# Patient Record
Sex: Male | Born: 1945
Health system: Southern US, Community
[De-identification: ages and names within clinical notes are randomized; demographics above are authoritative.]

## PROBLEM LIST (undated history)

## (undated) DIAGNOSIS — I10 Essential (primary) hypertension: Secondary | ICD-10-CM

## (undated) DIAGNOSIS — C439 Malignant melanoma of skin, unspecified: Secondary | ICD-10-CM

## (undated) DIAGNOSIS — N400 Enlarged prostate without lower urinary tract symptoms: Secondary | ICD-10-CM

## (undated) DIAGNOSIS — M21612 Bunion of left foot: Secondary | ICD-10-CM

## (undated) HISTORY — DX: Malignant melanoma of skin, unspecified: C43.9

## (undated) HISTORY — PX: TOOTH EXTRACTION: SUR596

## (undated) HISTORY — DX: Benign prostatic hyperplasia without lower urinary tract symptoms: N40.0

## (undated) HISTORY — PX: POLYPECTOMY: SHX149

## (undated) HISTORY — PX: MELANOMA EXCISION: SHX5266

## (undated) HISTORY — DX: Essential (primary) hypertension: I10

## (undated) HISTORY — DX: Bunion of left foot: M21.612

---

## 2005-08-28 ENCOUNTER — Ambulatory Visit: Payer: Self-pay | Admitting: Internal Medicine

## 2005-09-09 ENCOUNTER — Ambulatory Visit: Payer: Self-pay | Admitting: Internal Medicine

## 2005-10-28 ENCOUNTER — Ambulatory Visit: Payer: Self-pay | Admitting: Internal Medicine

## 2005-11-04 ENCOUNTER — Ambulatory Visit: Payer: Self-pay | Admitting: Internal Medicine

## 2006-09-23 ENCOUNTER — Ambulatory Visit: Payer: Self-pay | Admitting: Internal Medicine

## 2006-09-23 LAB — CONVERTED CEMR LAB
ALT: 29 units/L (ref 0–40)
Albumin: 4 g/dL (ref 3.5–5.2)
Alkaline Phosphatase: 29 units/L — ABNORMAL LOW (ref 39–117)
BUN: 8 mg/dL (ref 6–23)
Basophils Absolute: 0 10*3/uL (ref 0.0–0.1)
Basophils Relative: 0.7 % (ref 0.0–1.0)
CO2: 32 meq/L (ref 19–32)
Calcium: 9.6 mg/dL (ref 8.4–10.5)
Cholesterol: 205 mg/dL (ref 0–200)
Creatinine, Ser: 0.9 mg/dL (ref 0.4–1.5)
Direct LDL: 98.1 mg/dL
HDL: 78.3 mg/dL (ref >39.0)
MCHC: 34 g/dL (ref 30.0–36.0)
Monocytes Relative: 10 % (ref 3.0–11.0)
Platelets: 190 10*3/uL (ref 150–400)
Potassium: 4 meq/L (ref 3.5–5.1)
RBC: 4.56 M/uL (ref 4.22–5.81)
RDW: 12.1 % (ref 11.5–14.6)
TSH: 2.52 microintl units/mL (ref 0.35–5.50)
Total Bilirubin: 1 mg/dL (ref 0.3–1.2)
Total CHOL/HDL Ratio: 2.6
Total Protein: 6.8 g/dL (ref 6.0–8.3)
Triglycerides: 78 mg/dL (ref 0–149)
VLDL: 16 mg/dL (ref 0–40)

## 2006-09-30 ENCOUNTER — Ambulatory Visit: Payer: Self-pay | Admitting: Internal Medicine

## 2007-09-24 ENCOUNTER — Ambulatory Visit: Payer: Self-pay | Admitting: Internal Medicine

## 2007-09-24 LAB — CONVERTED CEMR LAB
Albumin: 3.9 g/dL (ref 3.5–5.2)
Alkaline Phosphatase: 34 units/L — ABNORMAL LOW (ref 39–117)
BUN: 13 mg/dL (ref 6–23)
Basophils Absolute: 0 10*3/uL (ref 0.0–0.1)
Bilirubin Urine: NEGATIVE
CO2: 31 meq/L (ref 19–32)
Calcium: 9.1 mg/dL (ref 8.4–10.5)
Cholesterol: 171 mg/dL (ref 0–200)
Eosinophils Absolute: 0 10*3/uL (ref 0.0–0.6)
GFR calc Af Amer: 110 mL/min
GFR calc non Af Amer: 91 mL/min
HDL: 54.5 mg/dL (ref >39.0)
Hemoglobin: 14.3 g/dL (ref 13.0–17.0)
Ketones, urine, test strip: NEGATIVE
Lymphocytes Relative: 16.1 % (ref 12.0–46.0)
MCHC: 33.5 g/dL (ref 30.0–36.0)
MCV: 94.4 fL (ref 78.0–100.0)
Monocytes Absolute: 0.4 10*3/uL (ref 0.2–0.7)
Monocytes Relative: 7.2 % (ref 3.0–11.0)
Neutro Abs: 4 10*3/uL (ref 1.4–7.7)
Nitrite: NEGATIVE
Platelets: 168 10*3/uL (ref 150–400)
Potassium: 4.1 meq/L (ref 3.5–5.1)
TSH: 0.89 microintl units/mL (ref 0.35–5.50)
Total Protein: 6.5 g/dL (ref 6.0–8.3)
Triglycerides: 86 mg/dL (ref 0–149)
pH: 6.5

## 2007-10-01 ENCOUNTER — Ambulatory Visit: Payer: Self-pay | Admitting: Internal Medicine

## 2007-10-01 DIAGNOSIS — G4700 Insomnia, unspecified: Secondary | ICD-10-CM | POA: Insufficient documentation

## 2007-10-03 HISTORY — PX: COLONOSCOPY: SHX174

## 2007-10-18 ENCOUNTER — Ambulatory Visit: Payer: Self-pay | Admitting: Gastroenterology

## 2007-10-28 ENCOUNTER — Encounter: Payer: Self-pay | Admitting: Internal Medicine

## 2007-10-28 ENCOUNTER — Encounter: Payer: Self-pay | Admitting: Gastroenterology

## 2007-10-28 ENCOUNTER — Ambulatory Visit: Payer: Self-pay | Admitting: Gastroenterology

## 2007-10-28 LAB — HM COLONOSCOPY

## 2008-02-08 ENCOUNTER — Telehealth: Payer: Self-pay | Admitting: Internal Medicine

## 2008-02-09 ENCOUNTER — Telehealth: Payer: Self-pay | Admitting: Internal Medicine

## 2008-08-29 ENCOUNTER — Ambulatory Visit: Payer: Self-pay | Admitting: Internal Medicine

## 2008-08-30 ENCOUNTER — Telehealth: Payer: Self-pay | Admitting: Internal Medicine

## 2008-09-04 LAB — CONVERTED CEMR LAB
Basophils Absolute: 0 10*3/uL (ref 0.0–0.1)
Bilirubin, Direct: 0.1 mg/dL (ref 0.0–0.3)
CO2: 31 meq/L (ref 19–32)
Calcium: 9.5 mg/dL (ref 8.4–10.5)
Cholesterol: 203 mg/dL (ref 0–200)
GFR calc Af Amer: 97 mL/min
Hemoglobin: 13.9 g/dL (ref 13.0–17.0)
Lymphocytes Relative: 28.1 % (ref 12.0–46.0)
MCHC: 34.8 g/dL (ref 30.0–36.0)
Monocytes Absolute: 0.4 10*3/uL (ref 0.1–1.0)
Neutro Abs: 3.8 10*3/uL (ref 1.4–7.7)
PSA: 0.81 ng/mL (ref 0.10–4.00)
Platelets: 182 10*3/uL (ref 150–400)
RDW: 12.6 % (ref 11.5–14.6)
Sodium: 139 meq/L (ref 135–145)
TSH: 1.57 microintl units/mL (ref 0.35–5.50)
Total Bilirubin: 1.1 mg/dL (ref 0.3–1.2)
Triglycerides: 42 mg/dL (ref 0–149)
VLDL: 8 mg/dL (ref 0–40)

## 2009-04-17 ENCOUNTER — Telehealth: Payer: Self-pay | Admitting: *Deleted

## 2009-10-08 ENCOUNTER — Ambulatory Visit: Payer: Self-pay | Admitting: Internal Medicine

## 2009-10-08 LAB — CONVERTED CEMR LAB
Albumin: 4.1 g/dL (ref 3.5–5.2)
BUN: 16 mg/dL (ref 6–23)
Basophils Relative: 0.7 % (ref 0.0–3.0)
Bilirubin Urine: NEGATIVE
Bilirubin, Direct: 0.1 mg/dL (ref 0.0–0.3)
CO2: 30 meq/L (ref 19–32)
Chloride: 102 meq/L (ref 96–112)
Cholesterol: 195 mg/dL (ref 0–200)
Creatinine, Ser: 1 mg/dL (ref 0.4–1.5)
Eosinophils Absolute: 0.1 10*3/uL (ref 0.0–0.7)
Glucose, Urine, Semiquant: NEGATIVE
Ketones, urine, test strip: NEGATIVE
MCHC: 33.2 g/dL (ref 30.0–36.0)
MCV: 97.7 fL (ref 78.0–100.0)
Monocytes Absolute: 0.3 10*3/uL (ref 0.1–1.0)
Neutrophils Relative %: 50.8 % (ref 43.0–77.0)
PSA: 0.91 ng/mL (ref 0.10–4.00)
RBC: 4.28 M/uL (ref 4.22–5.81)
Specific Gravity, Urine: 1.015
TSH: 2.06 microintl units/mL (ref 0.35–5.50)
Total CHOL/HDL Ratio: 2
Total Protein: 7 g/dL (ref 6.0–8.3)
Triglycerides: 63 mg/dL (ref 0.0–149.0)

## 2009-10-15 ENCOUNTER — Ambulatory Visit: Payer: Self-pay | Admitting: Internal Medicine

## 2010-09-04 NOTE — Assessment & Plan Note (Signed)
Summary: cpx//ccm   Vital Signs:  Patient profile:   65 year old male Height:      70 inches Weight:      147 pounds BMI:     21.17 Temp:     98.2 degrees F oral Pulse rate:   72 / minute Resp:     14 per minute BP sitting:   126 / 74  (left arm)  Vitals Entered By: Willy Eddy, LPN (October 15, 2009 3:01 PM) CC: cpx   CC:  cpx.  History of Present Illness: The pt was asked about all immunizations, health maint. services that are appropriate to their age and was given guidance on diet exercize  and weight management   Preventive Screening-Counseling & Management  Alcohol-Tobacco     Smoking Status: quit     Year Quit: 1977     Pack years: 5     Passive Smoke Exposure: no  Problems Prior to Update: 1)  Insomnia, Chronic, Mild  (ICD-307.42) 2)  Physical Examination  (ICD-V70.0) 3)  Family History of Melanoma  (ICD-V16.8)  Current Problems (verified): 1)  Insomnia, Chronic, Mild  (ICD-307.42) 2)  Physical Examination  (ICD-V70.0) 3)  Family History of Melanoma  (ICD-V16.8)  Medications Prior to Update: 1)  Bayer Aspirin 325 Mg  Tabs (Aspirin) 2)  Multivitamins   Tabs (Multiple Vitamin) .... Once Daily 3)  Vitamin C 1000 Mg  Tabs (Ascorbic Acid) .... Once Daily 4)  D 400   Caps (Vitamins A & D) .... Once Daily 5)  Glucosamine 1500 Complex  Caps (Glucosamine-Chondroit-Vit C-Mn) .Marland Kitchen.. 1 Once Daily 6)  Omega-3 350 Mg Caps (Omega-3 Fatty Acids) .Marland Kitchen.. 1 Once Daily  Current Medications (verified): 1)  Bayer Aspirin 325 Mg  Tabs (Aspirin) 2)  Multivitamins   Tabs (Multiple Vitamin) .... Once Daily 3)  Vitamin C 1000 Mg  Tabs (Ascorbic Acid) .... Once Daily 4)  D 400   Caps (Vitamins A & D) .... Once Daily 5)  Glucosamine 1500 Complex  Caps (Glucosamine-Chondroit-Vit C-Mn) .Marland Kitchen.. 1 Once Daily 6)  Omega 3 1000mg  .... 4  Once Daily  Allergies (verified): No Known Drug Allergies  Past History:  Family History: Last updated: 10/01/2007 Family History  Hypertension Family History of Melanoma mother passed form complicaton of alzhemenrs oin 40's  Social History: Last updated: 04/29/2007 Occupation: Married Former Smoker Alcohol use-yes Drug use-no Regular exercise-yes  Risk Factors: Exercise: yes (04/29/2007)  Risk Factors: Smoking Status: quit (10/15/2009) Passive Smoke Exposure: no (10/15/2009)  Past medical, surgical, family and social histories (including risk factors) reviewed, and no changes noted (except as noted below).  Past Medical History: Reviewed history from 10/01/2007 and no changes required. Unremarkable  Past Surgical History: Reviewed history from 10/01/2007 and no changes required. Flexible Sigmoidoscopy  2004  Family History: Reviewed history from 10/01/2007 and no changes required. Family History Hypertension Family History of Melanoma mother passed form complicaton of alzhemenrs oin 95's  Social History: Reviewed history from 04/29/2007 and no changes required. Occupation: Married Former Smoker Alcohol use-yes Drug use-no Regular exercise-yes  Review of Systems  The patient denies anorexia, fever, weight loss, weight gain, vision loss, decreased hearing, hoarseness, chest pain, syncope, dyspnea on exertion, peripheral edema, prolonged cough, headaches, hemoptysis, abdominal pain, melena, hematochezia, severe indigestion/heartburn, hematuria, incontinence, genital sores, muscle weakness, suspicious skin lesions, transient blindness, difficulty walking, depression, unusual weight change, abnormal bleeding, enlarged lymph nodes, angioedema, breast masses, and testicular masses.    Physical Exam  General:  Well-developed,well-nourished,in no  acute distress; alert,appropriate and cooperative throughout examination Head:  atraumatic and male-pattern balding.   Eyes:  pupils equal and pupils round.   Ears:  R ear normal and L ear normal.   Nose:  no external deformity and no nasal discharge.    Neck:  No deformities, masses, or tenderness noted. Lungs:  normal respiratory effort, normal breath sounds, no fremitus, and no wheezes.   Heart:  normal rate and regular rhythm.   Abdomen:  soft, normal bowel sounds, no distention, and no masses.   Rectal:  no external abnormalities and no masses.   Genitalia:  no urethral discharge.   Msk:  normal ROM, no joint swelling, no joint warmth, and no redness over joints.   Pulses:  R and L carotid,radial,femoral,dorsalis pedis and posterior tibial pulses are full and equal bilaterally Extremities:  No clubbing, cyanosis, edema, or deformity noted with normal full range of motion of all joints.   Neurologic:  No cranial nerve deficits noted. Station and gait are normal. Plantar reflexes are down-going bilaterally. DTRs are symmetrical throughout. Sensory, motor and coordinative functions appear intact. Skin:  Intact without suspicious lesions or rashes Cervical Nodes:  No lymphadenopathy noted Axillary Nodes:  No palpable lymphadenopathy Inguinal Nodes:  No significant adenopathy Psych:  Oriented X3, memory intact for recent and remote, and normally interactive.     Impression & Recommendations:  Problem # 1:  PHYSICAL EXAMINATION (ICD-V70.0) The pt was asked about all immunizations, health maint. services that are appropriate to their age and was given guidance on diet exercize  and weight management  Colonoscopy: abnormal (10/28/2007) Td Booster: Historical (08/04/2005)   Flu Vax: Historical (07/04/2009)   Chol: 195 (10/08/2009)   HDL: 83.80 (10/08/2009)   LDL: 99 (10/08/2009)   TG: 63.0 (10/08/2009) TSH: 2.06 (10/08/2009)   PSA: 0.91 (10/08/2009) Next Colonoscopy due:: 11/2012 (08/29/2008)  Discussed using sunscreen, use of alcohol, drug use, self testicular exam, routine dental care, routine eye care, routine physical exam, seat belts, multiple vitamins, osteoporosis prevention, adequate calcium intake in diet, and recommendations for  immunizations.  Discussed exercise and checking cholesterol.  Discussed gun safety, safe sex, and contraception. Also recommend checking PSA.  Complete Medication List: 1)  Bayer Aspirin 325 Mg Tabs (Aspirin) 2)  Multivitamins Tabs (Multiple vitamin) .... Once daily 3)  Vitamin C 1000 Mg Tabs (Ascorbic acid) .... Once daily 4)  D 400 Caps (Vitamins a & d) .... Once daily 5)  Glucosamine 1500 Complex Caps (Glucosamine-chondroit-vit c-mn) .Marland Kitchen.. 1 once daily 6)  Omega 3 1000mg   .... 4  once daily  Patient Instructions: 1)  Please schedule a follow-up appointment in 1 year.  CPX

## 2010-10-16 ENCOUNTER — Other Ambulatory Visit (INDEPENDENT_AMBULATORY_CARE_PROVIDER_SITE_OTHER): Payer: Managed Care, Other (non HMO) | Admitting: Internal Medicine

## 2010-10-16 DIAGNOSIS — Z0000 Encounter for general adult medical examination without abnormal findings: Secondary | ICD-10-CM

## 2010-10-16 LAB — BASIC METABOLIC PANEL
CO2: 27 mEq/L (ref 19–32)
Calcium: 9 mg/dL (ref 8.4–10.5)
GFR: 85.82 mL/min (ref >60.00)
Sodium: 134 mEq/L — ABNORMAL LOW (ref 135–145)

## 2010-10-16 LAB — POCT URINALYSIS DIPSTICK
Blood, UA: NEGATIVE
Glucose, UA: NEGATIVE
Spec Grav, UA: 1.015

## 2010-10-16 LAB — CBC WITH DIFFERENTIAL/PLATELET
Basophils Relative: 0.6 % (ref 0.0–3.0)
Eosinophils Relative: 1.1 % (ref 0.0–5.0)
Hemoglobin: 13.7 g/dL (ref 13.0–17.0)
Lymphocytes Relative: 33.7 % (ref 12.0–46.0)
Monocytes Relative: 8 % (ref 3.0–12.0)
Neutro Abs: 2.2 10*3/uL (ref 1.4–7.7)
RBC: 4.16 Mil/uL — ABNORMAL LOW (ref 4.22–5.81)
WBC: 3.9 10*3/uL — ABNORMAL LOW (ref 4.5–10.5)

## 2010-10-16 LAB — HEPATIC FUNCTION PANEL
AST: 35 U/L (ref 0–37)
Albumin: 4 g/dL (ref 3.5–5.2)
Alkaline Phosphatase: 31 U/L — ABNORMAL LOW (ref 39–117)
Total Protein: 6.2 g/dL (ref 6.0–8.3)

## 2010-10-16 LAB — PSA: PSA: 0.98 ng/mL (ref 0.10–4.00)

## 2010-10-16 LAB — LIPID PANEL
Cholesterol: 209 mg/dL — ABNORMAL HIGH (ref 0–200)
HDL: 97.2 mg/dL (ref >39.00)
VLDL: 9.2 mg/dL (ref 0.0–40.0)

## 2010-10-16 LAB — TSH: TSH: 2.8 u[IU]/mL (ref 0.35–5.50)

## 2010-10-22 ENCOUNTER — Encounter: Payer: Self-pay | Admitting: Internal Medicine

## 2010-10-23 ENCOUNTER — Ambulatory Visit (INDEPENDENT_AMBULATORY_CARE_PROVIDER_SITE_OTHER): Payer: Managed Care, Other (non HMO) | Admitting: Internal Medicine

## 2010-10-23 ENCOUNTER — Encounter: Payer: Self-pay | Admitting: Internal Medicine

## 2010-10-23 VITALS — BP 130/80 | HR 72 | Temp 98.1°F | Resp 14 | Ht 68.0 in | Wt 142.0 lb

## 2010-10-23 DIAGNOSIS — Z0000 Encounter for general adult medical examination without abnormal findings: Secondary | ICD-10-CM

## 2010-10-23 NOTE — Progress Notes (Signed)
  Subjective:    Patient ID: Thomas Craig, male    DOB: [birth year], 65 y.o.   MRN: [id number]  HPI  patient is a 65 year old white male presents for complete physical examination he has been healthy he is a runner he is on no prescription medications but takes a myriad of vitamins for her various issues including joints Alzheimer's prevention  And osteoporosis   Review of Systems  Constitutional: Negative for fever and fatigue.  HENT: Negative for hearing loss, congestion, neck pain and postnasal drip.   Eyes: Negative for discharge, redness and visual disturbance.  Respiratory: Negative for cough, shortness of breath and wheezing.   Cardiovascular: Negative for leg swelling.  Gastrointestinal: Negative for abdominal pain, constipation and abdominal distention.  Genitourinary: Negative for urgency and frequency.  Musculoskeletal: Negative for joint swelling and arthralgias.  Skin: Negative for color change and rash.  Neurological: Negative for weakness and light-headedness.  Hematological: Negative for adenopathy.  Psychiatric/Behavioral: Negative for behavioral problems.   Past Medical History  Diagnosis Date  . Arthritis    History reviewed. No pertinent past surgical history.  reports that he quit smoking about 35 years ago. He has never used smokeless tobacco. He reports that he drinks alcohol. He reports that he does not use illicit drugs. family history includes Alzheimer's disease in his father and mother; Cancer in his father; and Melanoma in an unspecified family member. No Known Allergies     Objective:   Physical Exam  Constitutional: He appears well-developed and well-nourished.  HENT:  Head: Normocephalic and atraumatic.  Eyes: Conjunctivae are normal. Pupils are equal, round, and reactive to light.  Neck: Normal range of motion. Neck supple.  Cardiovascular: Normal rate and regular rhythm.   Pulmonary/Chest: Effort normal and breath sounds normal.    Abdominal: Soft. Bowel sounds are normal.          Assessment & Plan:   Patient presents for yearly preventative medicine examination.   all immunizations and health maintenance protocols were reviewed with the patient and they are up to date with these protocols.   screening laboratory values were reviewed with the patient including screening of hyperlipidemia PSA renal function and hepatic function.   There medications past medical history social history problem list and allergies were reviewed in detail.   Goals were established with regard to weight loss exercise diet in compliance with medications

## 2010-11-29 ENCOUNTER — Telehealth: Payer: Self-pay | Admitting: *Deleted

## 2010-11-29 MED ORDER — SULFAMETHOXAZOLE-TRIMETHOPRIM 800-160 MG PO TABS: 1.0000 | ORAL_TABLET | Freq: Two times a day (BID) | ORAL | Status: AC

## 2010-11-29 NOTE — Telephone Encounter (Signed)
Probable prostatitis- start septra ds 1 bid for 2 weeks- call us Monday am and let us know if it has stopped

## 2010-11-29 NOTE — Telephone Encounter (Signed)
Notified pt. 

## 2010-11-29 NOTE — Telephone Encounter (Signed)
Pt calls stating since this am, he has had gross hematuria with no pain or other symptoms.

## 2010-12-11 ENCOUNTER — Telehealth: Payer: Self-pay | Admitting: Internal Medicine

## 2010-12-11 NOTE — Telephone Encounter (Signed)
Pt has allergic reaction to antibiotic. Pt had red face,elevated bp, rash on chest, low grade fever. Pt discontinued antibiotic yesterday. There are 6 pills remaining. Pt has sulfa allergy. Pt just wanted to make Dr Lovell Sheehan aware. Also wants to know if he needs ov?   Pt has a neighbor,Steve Daub, who is a Librarian, academic.  Pts neighbor told pt to take Benadryl and Claritin otc and to stop antibiotic.

## 2010-12-11 NOTE — Telephone Encounter (Signed)
Will place on chart and if sx return let us know

## 2010-12-16 ENCOUNTER — Encounter: Payer: Self-pay | Admitting: Internal Medicine

## 2011-09-29 ENCOUNTER — Ambulatory Visit (INDEPENDENT_AMBULATORY_CARE_PROVIDER_SITE_OTHER): Payer: Managed Care, Other (non HMO) | Admitting: Sports Medicine

## 2011-09-29 VITALS — BP 138/80 | Ht 68.0 in | Wt 145.0 lb

## 2011-09-29 DIAGNOSIS — M25579 Pain in unspecified ankle and joints of unspecified foot: Secondary | ICD-10-CM

## 2011-09-29 DIAGNOSIS — M25571 Pain in right ankle and joints of right foot: Secondary | ICD-10-CM | POA: Insufficient documentation

## 2011-09-29 MED ORDER — KETOPROFEN POWD: Status: DC

## 2011-09-29 NOTE — Patient Instructions (Signed)
1. Wear your black ankle sleeve when you run.  2. You can run but only if you don't have pain or swelling in your ankle.  3. Ice your ankle for 10-15 minutes after you run.  4. We are giving you a topical anti-inflammatory medicine.  Use this 4 times daily.  5. You are ready to run when your limp goes away.  6. Follow up with [place] 3-4 weeks.

## 2011-09-29 NOTE — Progress Notes (Signed)
  Subjective:    Patient ID: Thomas Craig, male    DOB: [birth year], 66 y.o.   MRN: [id number]  HPI 66 y/o male is here c/o right ankle pain.  He thinks the original injury was in December when he had a misstep back in December.  However, he only had pain for one day.  Now that he is training for his marathon he noticed that he had a limp after a 16 mile long run about 3 weeks ago.  On January 31st he noticed that his ankle was swollen.  He took 4 days off of running and still couldn't run.  He took a week off, then was able to run one mile.  Then he tried to run 1.5 miles but had to walk back.  He took another week off.  Last Monday he ran 3 miles, Thursday he ran 5 miles both with no problem.  On Saturday he tried an 8 mile run and had pain 6 miles in.  He tried to walk run but the pain was too bad so he had to walk back.  He has been wearing a compression sock for 3 weeks which helps with the swelling.  He doesn't really have pain with normal activity.  When he does have pain it is located in the vicinity of the posterior tibialis.   Review of Systems     Objective:   Physical Exam  Normal range of motion at the ankle Pain with resisted inversion Mild swelling on the lateral aspect Strength is 5/5 in all directions. Stable lateral and medial ligaments Crepitus with anterior drawer Squeeze test negatvie Talar dome nontender; No tenderness over N spot or navicular prominence No tenderness on posterior aspects of lateral and medial malleolus No sign of peroneal tendon subluxations;   Ultrasound Increased fluid in the joint space The talus has an irregular border and some spurring, medially The posterior tibialis tendons have some fluid surrounding but not within the tendon At the inferior portion of the medial malleolus the posterior tibialis tendons show wear but are intact The peroneal tendons are unremarkable       Assessment & Plan:

## 2011-09-30 NOTE — Assessment & Plan Note (Addendum)
His ongoing pain and swelling is likely secondary to a bony contusion involving the talus.  We will treat this with support and relative rest.  His activity is as tolerated including running.  We have given him ankle support to wear with running.  We have given him exercise instruction as outlined in the patient handouts.

## 2011-10-20 ENCOUNTER — Encounter: Payer: Self-pay | Admitting: Sports Medicine

## 2011-10-20 ENCOUNTER — Ambulatory Visit (INDEPENDENT_AMBULATORY_CARE_PROVIDER_SITE_OTHER): Payer: Managed Care, Other (non HMO) | Admitting: Sports Medicine

## 2011-10-20 VITALS — BP 144/80

## 2011-10-20 DIAGNOSIS — R269 Unspecified abnormalities of gait and mobility: Secondary | ICD-10-CM

## 2011-10-20 DIAGNOSIS — M21612 Bunion of left foot: Secondary | ICD-10-CM

## 2011-10-20 DIAGNOSIS — M21619 Bunion of unspecified foot: Secondary | ICD-10-CM

## 2011-10-20 DIAGNOSIS — M25579 Pain in unspecified ankle and joints of unspecified foot: Secondary | ICD-10-CM

## 2011-10-20 DIAGNOSIS — M25571 Pain in right ankle and joints of right foot: Secondary | ICD-10-CM

## 2011-10-20 HISTORY — DX: Bunion of left foot: M21.612

## 2011-10-20 NOTE — Progress Notes (Signed)
Pt here today to f/u his right ankle, which he says is about 100% better with some swelling.

## 2011-10-20 NOTE — Assessment & Plan Note (Signed)
He has an efficient running gait overall  However, he will need control of pronation to keep him from developing more issues with bunions Also has some damage to talus on RT and need to support this to prevent DJD

## 2011-10-20 NOTE — Progress Notes (Signed)
  Subjective:    Patient ID: Thomas Craig, male    DOB: 1946-01-02, 66 y.o.   MRN: [id number]  HPI  Pt here today to f/u his right ankle, which he says is about 100% better with some swelling.  Since last visit, he stopped running for 2-3 weeks and has been working with PT and doing ankle strength exercises.  Patient was taking Motrin TID and topical NSAID which has improved pain significantly.    He started running again 4 days ago for about 30-40 minutes at a time without any difficulty.  He has been wearing his ankle brace while running.  Patient also states that he "rolled his right ankle" one week ago in [place] on uneven pavement.  Since then he has had some lateral ankle swelling but no pain.  He has been using shoe insoles in both pairs of sneakers.  Soc Hx - works at [employer] so sees PT in [place] Given some posting for insoles that also feels good Review of Systems  Per HPI    Objective:   Physical Exam  Ankle: No visible erythema. Mild swelling on lateral aspect of ankle, but much improved from previous exam. Range of motion is full in all directions. Strength is 5/5 in all directions. Stable lateral and medial ligaments; squeeze test and kleiger test unremarkable; Talar dome nontender; No pain at base of 5th MT; No tenderness over cuboid; No tenderness over N spot or navicular prominence No tenderness on posterior aspects of lateral and medial malleolus No sign of peroneal tendon subluxations; Able to run up and down hallway without pain.  He pronates right foot and has delayed push off.  Foot shows bunion on left There is some hallux rigidus and MTP hypertrophy on RT 1st toe Pronated with loss of long arch bilat  Running gait shows mild foot turnout He does have pronation with gait  Overall pushoff is not quite even on RT but no real limp now       Assessment & Plan:

## 2011-10-20 NOTE — Assessment & Plan Note (Signed)
I think to stay at his current running level he needs better foot support Lots of arch collapse and early bunions  We will try to get him set for orthotics

## 2011-10-20 NOTE — Patient Instructions (Signed)
1. Wear ankle brace for 2 more weeks, then you can run without it. 2. Continue ankle strength exercises and stretching daily. 3. Keep using shoe insoles, you may alternate sneakers. 4. Continue to ice ankle after runs and take Motrin as needed for severe pain. 5. You will likely benefit from custom orthotics in the future to prevent worsening arthritis. 6.  Schedule appointment for new orthotics at your earliest convenience.

## 2011-10-20 NOTE — Assessment & Plan Note (Signed)
Patient's ankle pain has resolved, swelling is slowly improving. Continue to run with ankle support and continue ankle strength exercises with PT. Increase long distance training as tolerated, okay to proceed with [employer]. Patient to return to clinic for new custom orthotics at his earliest convenience.

## 2011-10-24 ENCOUNTER — Other Ambulatory Visit: Payer: Managed Care, Other (non HMO)

## 2011-10-27 ENCOUNTER — Other Ambulatory Visit (INDEPENDENT_AMBULATORY_CARE_PROVIDER_SITE_OTHER): Payer: Managed Care, Other (non HMO)

## 2011-10-27 DIAGNOSIS — Z0000 Encounter for general adult medical examination without abnormal findings: Secondary | ICD-10-CM

## 2011-10-27 LAB — POCT URINALYSIS DIPSTICK
Glucose, UA: NEGATIVE
Ketones, UA: NEGATIVE
Spec Grav, UA: 1.01
Urobilinogen, UA: 0.2

## 2011-10-27 LAB — LIPID PANEL: Triglycerides: 65 mg/dL (ref 0.0–149.0)

## 2011-10-27 LAB — PSA: PSA: 1.18 ng/mL (ref 0.10–4.00)

## 2011-10-27 LAB — CBC WITH DIFFERENTIAL/PLATELET
Basophils Absolute: 0 10*3/uL (ref 0.0–0.1)
Lymphocytes Relative: 19.4 % (ref 12.0–46.0)
Monocytes Relative: 8.3 % (ref 3.0–12.0)
Neutrophils Relative %: 71 % (ref 43.0–77.0)
Platelets: 177 10*3/uL (ref 150.0–400.0)
RDW: 13.1 % (ref 11.5–14.6)

## 2011-10-27 LAB — TSH: TSH: 1.96 u[IU]/mL (ref 0.35–5.50)

## 2011-10-27 LAB — BASIC METABOLIC PANEL
BUN: 19 mg/dL (ref 6–23)
Calcium: 9.2 mg/dL (ref 8.4–10.5)
GFR: 80.58 mL/min (ref >60.00)
Glucose, Bld: 91 mg/dL (ref 70–99)
Sodium: 130 mEq/L — ABNORMAL LOW (ref 135–145)

## 2011-10-27 LAB — HEPATIC FUNCTION PANEL
ALT: 34 U/L (ref 0–53)
AST: 43 U/L — ABNORMAL HIGH (ref 0–37)
Bilirubin, Direct: 0.1 mg/dL (ref 0.0–0.3)
Total Bilirubin: 0.9 mg/dL (ref 0.3–1.2)

## 2011-10-27 LAB — LDL CHOLESTEROL, DIRECT: Direct LDL: 100.5 mg/dL

## 2011-10-27 NOTE — Progress Notes (Signed)
Addended by: Rita Ohara R on: 10/27/2011 10:49 AM   Modules accepted: Orders

## 2011-10-31 ENCOUNTER — Encounter: Payer: Managed Care, Other (non HMO) | Admitting: Internal Medicine

## 2011-11-05 ENCOUNTER — Encounter: Payer: Self-pay | Admitting: Internal Medicine

## 2011-11-05 ENCOUNTER — Ambulatory Visit (INDEPENDENT_AMBULATORY_CARE_PROVIDER_SITE_OTHER): Payer: Managed Care, Other (non HMO) | Admitting: Internal Medicine

## 2011-11-05 VITALS — BP 132/80 | HR 60 | Temp 98.0°F | Resp 14 | Ht 68.0 in | Wt 150.0 lb

## 2011-11-05 DIAGNOSIS — Z0000 Encounter for general adult medical examination without abnormal findings: Secondary | ICD-10-CM

## 2011-11-05 DIAGNOSIS — R7989 Other specified abnormal findings of blood chemistry: Secondary | ICD-10-CM

## 2011-11-05 DIAGNOSIS — T887XXA Unspecified adverse effect of drug or medicament, initial encounter: Secondary | ICD-10-CM

## 2011-11-05 MED ORDER — KETOPROFEN 50 MG PO CAPS: 50.0000 mg | ORAL_CAPSULE | Freq: Four times a day (QID) | ORAL | Status: DC | PRN

## 2011-11-05 NOTE — Patient Instructions (Signed)
The patient is instructed to continue all medications as prescribed. Schedule followup with check out clerk upon leaving the clinic  

## 2011-11-05 NOTE — Progress Notes (Signed)
Subjective:    Patient ID: Thomas Craig, male    DOB: 03/06/1946, 66 y.o.   MRN: [id number]  HPI Patient is a 66 year old male who presents for his yearly physical examination.  He's recently had a new injury to his right ankle area and is followed by Dr. Darrick Penna in the sports medicine clinic.   Review of Systems  Constitutional: Negative for fever and fatigue.  HENT: Negative for hearing loss, congestion, neck pain and postnasal drip.   Eyes: Negative for discharge, redness and visual disturbance.  Respiratory: Negative for cough, shortness of breath and wheezing.   Cardiovascular: Negative for leg swelling.  Gastrointestinal: Negative for abdominal pain, constipation and abdominal distention.  Genitourinary: Negative for urgency and frequency.  Musculoskeletal: Negative for joint swelling and arthralgias.  Skin: Negative for color change and rash.  Neurological: Negative for weakness and light-headedness.  Hematological: Negative for adenopathy.  Psychiatric/Behavioral: Negative for behavioral problems.   Past Medical History  Diagnosis Date  . Arthritis     History   Social History  . Marital Status: Married    Spouse Name: N/A    Number of Children: N/A  . Years of Education: N/A   Occupational History  . Not on file.   Social History Main Topics  . Smoking status: Former Smoker    Quit date: 08/05/1975  . Smokeless tobacco: Never Used   Comment: ONLY SMOKED FOR 5 Y EARS  . Alcohol Use: Yes  . Drug Use: No  . Sexually Active: Yes   Other Topics Concern  . Not on file   Social History Narrative  . No narrative on file    No past surgical history on file.  Family History  Problem Relation Age of Onset  . Melanoma    . Alzheimer's disease Mother   . Alzheimer's disease Father   . Cancer Father     melanoma    Allergies  Allergen Reactions  . Doxycycline     Current Outpatient Prescriptions on File Prior to Visit  Medication Sig Dispense  Refill  . Ascorbic Acid (VITAMIN C) 1000 MG tablet Take 1,000 mg by mouth daily. 1-3 qd       . aspirin 325 MG tablet Take 325 mg by mouth daily.        . Calcium Carbonate-Vitamin D (CALCIUM-VITAMIN D) 500-200 MG-UNIT per tablet Take 1 tablet by mouth daily.        . Ketoprofen POWD Ketoprofen 20% gel aaa tid-qid  100 g  2  . Multiple Vitamin (MULTIVITAMIN) tablet Take 1 tablet by mouth daily.        . OMEGA-3 1000 MG CAPS Take 4 capsules by mouth daily.        . Saw Palmetto, Serenoa repens, 1000 MG CAPS Take by mouth 1 day or 1 dose.       . vitamin E 400 UNIT capsule Take 400 Units by mouth daily.          BP 132/80  Pulse 60  Temp 98 F (36.7 C)  Resp 14  Ht 5\' 8"  (1.727 m)  Wt 150 lb (68.04 kg)  BMI 22.81 kg/m2       Objective:   Physical Exam  Nursing note and vitals reviewed. Constitutional: He is oriented to person, place, and time. He appears well-developed and well-nourished.  HENT:  Head: Normocephalic and atraumatic.  Eyes: Conjunctivae are normal. Pupils are equal, round, and reactive to light.  Neck: Normal range of motion. Neck supple.  Cardiovascular: Normal rate and regular rhythm.   Pulmonary/Chest: Effort normal and breath sounds normal.  Abdominal: Soft. Bowel sounds are normal.  Genitourinary: Rectum normal and prostate normal.  Musculoskeletal: Normal range of motion.  Neurological: He is alert and oriented to person, place, and time.  Skin: Skin is warm and dry.  Psychiatric: He has a normal mood and affect. His behavior is normal.          Assessment & Plan:   Patient presents for yearly preventative medicine examination.   all immunizations and health maintenance protocols were reviewed with the patient and they are up to date with these protocols.   screening laboratory values were reviewed with the patient including screening of hyperlipidemia PSA renal function and hepatic function.   There medications past medical history social  history problem list and allergies were reviewed in detail.   Goals were established with regard to weight loss exercise diet in compliance with medications Elevation of liver functions protected due to excessive use of nonsteroidals also noted a low sodium low chloride probably due to acidosis.  Recommend never taking more than one nonsteroidal at that time even if it is a topical nonsteroidal on a regular basis.

## 2011-11-24 ENCOUNTER — Ambulatory Visit (INDEPENDENT_AMBULATORY_CARE_PROVIDER_SITE_OTHER): Payer: Managed Care, Other (non HMO) | Admitting: Sports Medicine

## 2011-11-24 VITALS — BP 165/80

## 2011-11-24 DIAGNOSIS — R269 Unspecified abnormalities of gait and mobility: Secondary | ICD-10-CM

## 2011-11-26 NOTE — Assessment & Plan Note (Addendum)
Orthotics made today.  These offer a nice correction to his over pronation.    These felt comfortable in office  Use consistetnly next month  We will adjust after that if needed  He will monitor RT ankle and left bunion pain

## 2011-11-26 NOTE — Progress Notes (Signed)
  Subjective:    Patient ID: Thomas Craig, male    DOB: 02/27/1946, 66 y.o.   MRN: [id number]  HPI 66 y/o male is here for custom orthotics.   Has bunion on left Ankle pain on RT RT ankle shows subluxation of foot on ankle with lateral deviaiton of foot Some of same changes developing on LT  Long term distance runner and wants to keep doing marathons Just ran [employer]   Review of Systems     Objective:   Physical Exam  Feet are  unchanged from previous exam.  Patient was fitted for a : standard, cushioned, semi-rigid orthotic. The orthotic was heated and afterward the patient stood on the orthotic blank positioned on the orthotic stand. The patient was positioned in subtalar neutral position and 10 degrees of ankle dorsiflexion in a weight bearing stance. After completion of molding, a stable base was applied to the orthotic blank. The blank was ground to a stable position for weight bearing. Size: 10 Base: blue Posting: none       Assessment & Plan:

## 2011-12-23 ENCOUNTER — Ambulatory Visit: Payer: Managed Care, Other (non HMO) | Admitting: Sports Medicine

## 2012-02-04 ENCOUNTER — Encounter: Payer: Self-pay | Admitting: Internal Medicine

## 2012-02-04 ENCOUNTER — Ambulatory Visit (INDEPENDENT_AMBULATORY_CARE_PROVIDER_SITE_OTHER): Payer: Managed Care, Other (non HMO) | Admitting: Internal Medicine

## 2012-02-04 VITALS — BP 136/80 | HR 72 | Temp 98.6°F | Resp 16 | Ht 68.0 in | Wt 151.0 lb

## 2012-02-04 DIAGNOSIS — M25579 Pain in unspecified ankle and joints of unspecified foot: Secondary | ICD-10-CM

## 2012-02-04 DIAGNOSIS — M25571 Pain in right ankle and joints of right foot: Secondary | ICD-10-CM

## 2012-02-04 DIAGNOSIS — T887XXA Unspecified adverse effect of drug or medicament, initial encounter: Secondary | ICD-10-CM

## 2012-02-04 DIAGNOSIS — R269 Unspecified abnormalities of gait and mobility: Secondary | ICD-10-CM

## 2012-02-04 LAB — HEPATIC FUNCTION PANEL
AST: 32 U/L (ref 0–37)
Total Bilirubin: 0.9 mg/dL (ref 0.3–1.2)

## 2012-02-04 NOTE — Patient Instructions (Addendum)
The patient is instructed to continue all medications as prescribed. Schedule followup with check out clerk upon leaving the clinic  

## 2012-02-04 NOTE — Progress Notes (Signed)
Subjective:    Patient ID: Thomas Craig, male    DOB: July 19, 1946, 66 y.o.   MRN: [id number]  HPI  The pt has been running and has been using orthotics Has not been taking the IBU Swelling has been better  Review of Systems  Constitutional: Negative for fever and fatigue.  HENT: Negative for hearing loss, congestion, neck pain and postnasal drip.   Eyes: Negative for discharge, redness and visual disturbance.  Respiratory: Negative for cough, shortness of breath and wheezing.   Cardiovascular: Negative for leg swelling.  Gastrointestinal: Negative for abdominal pain, constipation and abdominal distention.  Genitourinary: Negative for urgency and frequency.  Musculoskeletal: Negative for joint swelling and arthralgias.  Skin: Negative for color change and rash.  Neurological: Negative for weakness and light-headedness.  Hematological: Negative for adenopathy.  Psychiatric/Behavioral: Negative for behavioral problems.   Past Medical History  Diagnosis Date  . Arthritis     History   Social History  . Marital Status: Married    Spouse Name: N/A    Number of Children: N/A  . Years of Education: N/A   Occupational History  . Not on file.   Social History Main Topics  . Smoking status: Former Smoker    Quit date: 08/05/1975  . Smokeless tobacco: Never Used   Comment: ONLY SMOKED FOR 5 Y EARS  . Alcohol Use: Yes  . Drug Use: No  . Sexually Active: Yes   Other Topics Concern  . Not on file   Social History Narrative  . No narrative on file    No past surgical history on file.  Family History  Problem Relation Age of Onset  . Melanoma    . Alzheimer's disease Mother   . Alzheimer's disease Father   . Cancer Father     melanoma    Allergies  Allergen Reactions  . Doxycycline     Current Outpatient Prescriptions on File Prior to Visit  Medication Sig Dispense Refill  . Ascorbic Acid (VITAMIN C) 1000 MG tablet Take 1,000 mg by mouth daily. 1-3 qd     . aspirin 325 MG tablet Take 325 mg by mouth daily.        . Calcium Carbonate-Vitamin D (CALCIUM-VITAMIN D) 500-200 MG-UNIT per tablet Take 1 tablet by mouth daily.        . Glucosamine-Chondroit-Vit C-Mn (GLUCOSAMINE CHONDR 1500 COMPLX) CAPS Take 1 capsule by mouth daily.      . Multiple Vitamin (MULTIVITAMIN) tablet Take 1 tablet by mouth daily.        . OMEGA-3 1000 MG CAPS Take 4 capsules by mouth daily.        . Saw Palmetto, Serenoa repens, 1000 MG CAPS Take by mouth 1 day or 1 dose.         BP 136/80  Pulse 72  Temp 98.6 F (37 C)  Resp 16  Ht 5\' 8"  (1.727 m)  Wt 151 lb (68.493 kg)  BMI 22.96 kg/m2        Objective:   Physical Exam  Nursing note and vitals reviewed. Constitutional: He is oriented to person, place, and time.  Musculoskeletal: Normal range of motion.  Neurological: He is alert and oriented to person, place, and time.  Skin: Skin is warm and dry.  Psychiatric: He has a normal mood and affect. His behavior is normal.          Assessment & Plan:  Discussed, related to trauma from per spray in the [facility].  His  ankle injury has improved.  We discussed abnormal liver functions at last office visit which were felt to be secondary to nonsteroidal use.  Repeat liver functions today and notify patient of any abnormality.  Physical in March

## 2012-10-11 ENCOUNTER — Other Ambulatory Visit (INDEPENDENT_AMBULATORY_CARE_PROVIDER_SITE_OTHER): Payer: Managed Care, Other (non HMO)

## 2012-10-11 DIAGNOSIS — Z0000 Encounter for general adult medical examination without abnormal findings: Secondary | ICD-10-CM

## 2012-10-11 LAB — BASIC METABOLIC PANEL
BUN: 16 mg/dL (ref 6–23)
Calcium: 9 mg/dL (ref 8.4–10.5)
Creatinine, Ser: 0.9 mg/dL (ref 0.4–1.5)
GFR: 88.54 mL/min (ref >60.00)
Glucose, Bld: 88 mg/dL (ref 70–99)
Sodium: 133 mEq/L — ABNORMAL LOW (ref 135–145)

## 2012-10-11 LAB — CBC WITH DIFFERENTIAL/PLATELET
Basophils Relative: 0.6 % (ref 0.0–3.0)
Eosinophils Relative: 0.7 % (ref 0.0–5.0)
HCT: 36.9 % — ABNORMAL LOW (ref 39.0–52.0)
Hemoglobin: 12.6 g/dL — ABNORMAL LOW (ref 13.0–17.0)
Lymphs Abs: 1.2 10*3/uL (ref 0.7–4.0)
MCV: 92.3 fl (ref 78.0–100.0)
Monocytes Absolute: 0.3 10*3/uL (ref 0.1–1.0)
Neutro Abs: 2.3 10*3/uL (ref 1.4–7.7)
Platelets: 222 10*3/uL (ref 150.0–400.0)
WBC: 3.8 10*3/uL — ABNORMAL LOW (ref 4.5–10.5)

## 2012-10-11 LAB — HEPATIC FUNCTION PANEL
AST: 32 U/L (ref 0–37)
Albumin: 3.6 g/dL (ref 3.5–5.2)
Total Bilirubin: 0.9 mg/dL (ref 0.3–1.2)

## 2012-10-11 LAB — POCT URINALYSIS DIPSTICK
Bilirubin, UA: NEGATIVE
Ketones, UA: NEGATIVE
Leukocytes, UA: NEGATIVE
Nitrite, UA: NEGATIVE

## 2012-10-11 LAB — TSH: TSH: 1.98 u[IU]/mL (ref 0.35–5.50)

## 2012-10-11 LAB — PSA: PSA: 1.11 ng/mL (ref 0.10–4.00)

## 2012-10-18 ENCOUNTER — Encounter: Payer: Self-pay | Admitting: Internal Medicine

## 2012-10-18 ENCOUNTER — Ambulatory Visit (INDEPENDENT_AMBULATORY_CARE_PROVIDER_SITE_OTHER): Payer: Managed Care, Other (non HMO) | Admitting: Internal Medicine

## 2012-10-18 VITALS — BP 140/90 | HR 68 | Temp 98.0°F | Ht 67.0 in | Wt 149.0 lb

## 2012-10-18 DIAGNOSIS — Z0000 Encounter for general adult medical examination without abnormal findings: Secondary | ICD-10-CM

## 2012-10-18 DIAGNOSIS — Z23 Encounter for immunization: Secondary | ICD-10-CM

## 2012-10-18 DIAGNOSIS — D509 Iron deficiency anemia, unspecified: Secondary | ICD-10-CM

## 2012-10-18 NOTE — Progress Notes (Signed)
  Subjective:    Patient ID: Thomas Craig, male    DOB: Dec 10, 1945, 67 y.o.   MRN: [id number]  HPI  Anemia Otherwise stable screening labs  Review of Systems  Constitutional: Negative for fever and fatigue.  HENT: Negative for hearing loss, congestion, neck pain and postnasal drip.   Eyes: Negative for discharge, redness and visual disturbance.  Respiratory: Negative for cough, shortness of breath and wheezing.   Cardiovascular: Negative for leg swelling.  Gastrointestinal: Negative for abdominal pain, constipation and abdominal distention.  Genitourinary: Negative for urgency and frequency.  Musculoskeletal: Negative for joint swelling and arthralgias.  Skin: Positive for color change and pallor. Negative for rash.       Red palms  Neurological: Negative for weakness and light-headedness.  Hematological: Negative for adenopathy.  Psychiatric/Behavioral: Negative for behavioral problems.       Objective:   Physical Exam  Constitutional: He appears well-developed and well-nourished.  HENT:  Head: Normocephalic and atraumatic.  Eyes: Conjunctivae are normal. Pupils are equal, round, and reactive to light.  Neck: Normal range of motion. Neck supple.  Cardiovascular: Normal rate and regular rhythm.   Pulmonary/Chest: Effort normal and breath sounds normal.  Abdominal: Soft. Bowel sounds are normal.  Genitourinary:  Enlarged prostate smooth and no mases          Assessment & Plan:

## 2012-10-18 NOTE — Patient Instructions (Addendum)
Down load the my chart app on phone  Take the b12 samples

## 2012-10-20 LAB — METHYLMALONIC ACID, SERUM: Methylmalonic Acid, Quant: 0.1 umol/L (ref <0.40)

## 2012-10-25 ENCOUNTER — Encounter: Payer: Self-pay | Admitting: Internal Medicine

## 2012-12-13 ENCOUNTER — Other Ambulatory Visit: Payer: Self-pay | Admitting: Dermatology

## 2012-12-29 ENCOUNTER — Other Ambulatory Visit: Payer: Self-pay | Admitting: Dermatology

## 2013-04-20 ENCOUNTER — Ambulatory Visit (INDEPENDENT_AMBULATORY_CARE_PROVIDER_SITE_OTHER): Payer: Managed Care, Other (non HMO) | Admitting: Internal Medicine

## 2013-04-20 ENCOUNTER — Encounter: Payer: Self-pay | Admitting: Internal Medicine

## 2013-04-20 VITALS — BP 136/80 | HR 72 | Temp 98.6°F | Resp 16 | Ht 67.0 in | Wt 151.0 lb

## 2013-04-20 DIAGNOSIS — L910 Hypertrophic scar: Secondary | ICD-10-CM

## 2013-04-20 DIAGNOSIS — Z23 Encounter for immunization: Secondary | ICD-10-CM

## 2013-04-20 DIAGNOSIS — D509 Iron deficiency anemia, unspecified: Secondary | ICD-10-CM

## 2013-04-20 LAB — CBC WITH DIFFERENTIAL/PLATELET
Eosinophils Relative: 1 % (ref 0.0–5.0)
HCT: 40.8 % (ref 39.0–52.0)
Monocytes Relative: 7.9 % (ref 3.0–12.0)
Neutrophils Relative %: 56.4 % (ref 43.0–77.0)
Platelets: 186 10*3/uL (ref 150.0–400.0)
WBC: 4.6 10*3/uL (ref 4.5–10.5)

## 2013-04-20 LAB — IRON: Iron: 88 ug/dL (ref 42–165)

## 2013-04-20 NOTE — Progress Notes (Signed)
  Subjective:    Patient ID: Thomas Craig, male    DOB: 1946-06-07, 67 y.o.   MRN: [id number]  HPI Feeling well Still running.. Bunion stable Orthotic did not help PT has helped Annual mole check resulted in superficial melanoma    Review of Systems  Constitutional: Negative for fever and fatigue.  HENT: Negative for hearing loss, congestion, neck pain and postnasal drip.   Eyes: Negative for discharge, redness and visual disturbance.  Respiratory: Negative for cough, shortness of breath and wheezing.   Cardiovascular: Negative for leg swelling.  Gastrointestinal: Negative for abdominal pain, constipation and abdominal distention.  Genitourinary: Negative for urgency and frequency.  Musculoskeletal: Negative for joint swelling and arthralgias.  Skin: Negative for color change and rash.       Biopsy site reveiwed  Neurological: Negative for weakness and light-headedness.  Hematological: Negative for adenopathy.  Psychiatric/Behavioral: Negative for behavioral problems.       Objective:   Physical Exam  Nursing note and vitals reviewed. Constitutional: He appears well-developed and well-nourished.  HENT:  Head: Normocephalic and atraumatic.  Eyes: Conjunctivae are normal. Pupils are equal, round, and reactive to light.  Neck: Normal range of motion. Neck supple.  Cardiovascular: Normal rate and regular rhythm.   Pulmonary/Chest: Effort normal and breath sounds normal.  Abdominal: Soft. Bowel sounds are normal.          Assessment & Plan:  Moderate food pain from bursa Site for melanoma for wide excision Keloid reaction from the internal stitches but biospy site clear Vit e oil  Follow up for iron deficiency

## 2013-04-20 NOTE — Patient Instructions (Addendum)
Vit e oil to site of keloid

## 2013-04-20 NOTE — Addendum Note (Signed)
Addended by: Bonnye Fava on: 04/20/2013 09:34 AM   Modules accepted: Orders

## 2013-04-21 LAB — RETICULOCYTES
RBC.: 4.43 MIL/uL (ref 4.22–5.81)
Retic Ct Pct: 0.9 % (ref 0.4–2.3)

## 2013-04-28 ENCOUNTER — Encounter: Payer: Self-pay | Admitting: Internal Medicine

## 2013-09-23 ENCOUNTER — Other Ambulatory Visit: Payer: Self-pay | Admitting: Physician Assistant

## 2013-10-17 ENCOUNTER — Other Ambulatory Visit: Payer: Managed Care, Other (non HMO)

## 2013-10-24 ENCOUNTER — Encounter: Payer: Managed Care, Other (non HMO) | Admitting: Internal Medicine

## 2013-10-26 ENCOUNTER — Encounter: Payer: Managed Care, Other (non HMO) | Admitting: Internal Medicine

## 2014-04-28 ENCOUNTER — Other Ambulatory Visit: Payer: Managed Care, Other (non HMO)

## 2014-05-05 ENCOUNTER — Encounter: Payer: Managed Care, Other (non HMO) | Admitting: Internal Medicine

## 2014-05-17 ENCOUNTER — Encounter: Payer: Self-pay | Admitting: Family Medicine

## 2014-08-03 ENCOUNTER — Ambulatory Visit: Payer: Managed Care, Other (non HMO) | Admitting: Family Medicine

## 2014-09-05 ENCOUNTER — Encounter: Payer: Self-pay | Admitting: Family Medicine

## 2014-09-05 ENCOUNTER — Ambulatory Visit (INDEPENDENT_AMBULATORY_CARE_PROVIDER_SITE_OTHER): Payer: Managed Care, Other (non HMO) | Admitting: Family Medicine

## 2014-09-05 DIAGNOSIS — IMO0001 Reserved for inherently not codable concepts without codable children: Secondary | ICD-10-CM

## 2014-09-05 DIAGNOSIS — Z23 Encounter for immunization: Secondary | ICD-10-CM

## 2014-09-05 DIAGNOSIS — C439 Malignant melanoma of skin, unspecified: Secondary | ICD-10-CM

## 2014-09-05 DIAGNOSIS — R030 Elevated blood-pressure reading, without diagnosis of hypertension: Secondary | ICD-10-CM

## 2014-09-05 DIAGNOSIS — Z85820 Personal history of malignant melanoma of skin: Secondary | ICD-10-CM | POA: Insufficient documentation

## 2014-09-05 DIAGNOSIS — M21612 Bunion of left foot: Secondary | ICD-10-CM

## 2014-09-05 DIAGNOSIS — I10 Essential (primary) hypertension: Secondary | ICD-10-CM | POA: Insufficient documentation

## 2014-09-05 DIAGNOSIS — N400 Enlarged prostate without lower urinary tract symptoms: Secondary | ICD-10-CM

## 2014-09-05 DIAGNOSIS — M2012 Hallux valgus (acquired), left foot: Secondary | ICD-10-CM

## 2014-09-05 DIAGNOSIS — E785 Hyperlipidemia, unspecified: Secondary | ICD-10-CM

## 2014-09-05 HISTORY — DX: Benign prostatic hyperplasia without lower urinary tract symptoms: N40.0

## 2014-09-05 HISTORY — DX: Malignant melanoma of skin, unspecified: C43.9

## 2014-09-05 NOTE — Assessment & Plan Note (Signed)
Continue saw palmetto. Patient comfortable with current level of symptoms and does not want to take flomax.

## 2014-09-05 NOTE — Assessment & Plan Note (Addendum)
Intermittent elevation of Bp about every other visit. We discussed DASH diet. If elevated 2 visits in a row, would need to discuss BP medication. Home monitoring advised with sooner return reasons discussed.

## 2014-09-05 NOTE — Progress Notes (Signed)
Thomas Reddish, MD Phone: [phone]  Subjective:  Patient presents today to establish care with me as their new primary care provider. Patient was formerly a patient of Dr. Arnoldo Morale. Chief complaint-noted.   Elevated blood pressure-poor control about every other visit  BP Readings from Last 3 Encounters:  09/05/14 150/90  04/20/13 136/80  10/18/12 140/90  History of running marathons, wants to get back into it but history of injuries Home BP monitoring-no  Compliant with medications-yes without side effects ROS-Denies any CP, HA, SOB, blurry vision, LE edema.   Hyperlipidemia-mild poor control  Lab Results  Component Value Date   LDLCALC 110* 10/11/2012  On statin: no Regular exercise: yes, runner Diet: good amount of processed food as traveling for work ROS- no chest pain or shortness of breath. No myalgias  BPH reasonable control Saw palmetto has improved nocturia 3-5x a night to 1-2x a night.  ROS- no changes in urination pattern, no dribbling  The following were reviewed and entered/updated in epic: Past Medical History  Diagnosis Date  . Melanoma of skin 09/05/2014    Dr. Allyson Sabal. Every 6 month follow up.    Marland Kitchen BPH (benign prostatic hyperplasia) 09/05/2014    Saw palmetto has improved nocturia 3-5x a night to 1-2x a night.    . Bunion of great toe of left foot 10/20/2011    Hallux valgus shift on left with some early changes starting on RT    Patient Active Problem List   Diagnosis Date Noted  . Melanoma of skin 09/05/2014    Priority: High  . BPH (benign prostatic hyperplasia) 09/05/2014    Priority: Medium  . Elevated blood pressure 09/05/2014    Priority: Medium  . Hyperlipidemia 09/05/2014    Priority: Medium  . Bunion of great toe of left foot 10/20/2011    Priority: Low   Past Surgical History  Procedure Laterality Date  . Melanoma excision      Family History  Problem Relation Age of Onset  . Melanoma Father   . Alzheimer's disease Mother    lived to 55  . Alzheimer's disease Father     lived to 18  . Anxiety disorder Sister     agoraphobia    Medications- reviewed and updated Current Outpatient Prescriptions  Medication Sig Dispense Refill  . Ascorbic Acid (VITAMIN C) 1000 MG tablet Take 1,000 mg by mouth daily. 1-3 qd     . aspirin 325 MG tablet Take 325 mg by mouth daily.      . Calcium Carbonate-Vitamin D (CALCIUM-VITAMIN D) 500-200 MG-UNIT per tablet Take 1 tablet by mouth daily.      . Glucosamine-Chondroit-Vit C-Mn (GLUCOSAMINE CHONDR 1500 COMPLX) CAPS Take 1 capsule by mouth daily.    . Multiple Vitamin (MULTIVITAMIN) tablet Take 1 tablet by mouth daily.      . multivitamin-lutein (OCUVITE-LUTEIN) CAPS Take 1 capsule by mouth daily.    . OMEGA-3 1000 MG CAPS Take 4 capsules by mouth daily.      . Saw Palmetto, Serenoa repens, 1000 MG CAPS Take by mouth 1 day or 1 dose.     Nocturia 3-5x a night, now 1-2  Allergies-reviewed and updated Allergies  Allergen Reactions  . Doxycycline     History   Social History  . Marital Status: Married    Spouse Name: N/A    Number of Children: N/A  . Years of Education: N/A   Social History Main Topics  . Smoking status: Former Smoker -- 0.75 packs/day for 5  years    Types: Cigarettes    Quit date: 08/05/1975  . Smokeless tobacco: Never Used     Comment: ONLY SMOKED FOR 5 Y EARS  . Alcohol Use: 6.0 - 8.4 oz/week    10-14 Not specified per week  . Drug Use: No  . Sexual Activity: Yes   Other Topics Concern  . None   Social History Narrative   Married 1975 (Wife with another Conservation officer, historic buildings). 3 daughters [phone] in 2016). 2 granddaughters (only youngest married)      Works in [place] for [employer]	[place]. Runs tax group.       Hobbies: running (huge passion runs marathons), biking, gardening, occasional golf    ROS--See HPI   Objective: BP 150/90 mmHg  Temp(Src) 98.4 F (36.9 C)  Wt 157 lb (71.215 kg) Gen: NAD, resting comfortably HEENT:  Mucous membranes are moist. Oropharynx normal. Good dentition.  CV: RRR no murmurs rubs or gallops Lungs: CTAB no crackles, wheeze, rhonchi Abdomen: soft/nontender/nondistended/normal bowel sounds.  Ext: no edema, left foot with hallux valgus, callus noted where this abuts toe 2.  Skin: warm, dry, no rash  Neuro: grossly normal, moves all extremities, PERRLA   Assessment/Plan:  Elevated blood pressure Intermittent elevation of Bp about every other visit. We discussed DASH diet. If elevated 2 visits in a row, would need to discuss BP medication. Home monitoring advised with sooner return reasons discussed.    Hyperlipidemia Offered lipids at this time but patient declines. We discussed elevated LDL at 110 but all other #s normal. HDL excellent around 60 regularly. On ASA to lower risk. No statin at present. Calculate 10 year risk with next lipids. Discussed lowering processed foods and restarting exercise.    BPH (benign prostatic hyperplasia) Continue saw palmetto. Patient comfortable with current level of symptoms and does not want to take flomax.    Bunion of great toe of left foot Bunion noted on exam, gets some blisters on tops of toes 2-4 with running at times, we discussed returning to Dr. Oneida Alar to discuss orthotics.    Return precautions advised. 6-12 months for CPE  Orders Placed This Encounter  Procedures  . Pneumococcal conjugate vaccine 13-valent

## 2014-09-05 NOTE — Patient Instructions (Addendum)
Blood pressure up today at 150/90. Monitor at home at pharmacies or other stores with goal <140/90. Use dash eating plan. Happy to see you sooner if Bp elevated.   Otherwise 6-12 months for annual physical  Prevnar today   DASH Eating Plan DASH stands for "Dietary Approaches to Stop Hypertension." The DASH eating plan is a healthy eating plan that has been shown to reduce high blood pressure (hypertension). Additional health benefits may include reducing the risk of type 2 diabetes mellitus, heart disease, and stroke. The DASH eating plan may also help with weight loss. WHAT DO I NEED TO KNOW ABOUT THE DASH EATING PLAN? For the DASH eating plan, you will follow these general guidelines:  Choose foods with a percent daily value for sodium of less than 5% (as listed on the food label).  Use salt-free seasonings or herbs instead of table salt or sea salt.  Check with your health care provider or pharmacist before using salt substitutes.  Eat lower-sodium products, often labeled as "lower sodium" or "no salt added."  Eat fresh foods.  Eat more vegetables, fruits, and low-fat dairy products.  Choose whole grains. Look for the word "whole" as the first word in the ingredient list.  Choose fish and skinless chicken or [place] more often than red meat. Limit fish, poultry, and meat to 6 oz (170 g) each day.  Limit sweets, desserts, sugars, and sugary drinks.  Choose heart-healthy fats.  Limit cheese to 1 oz (28 g) per day.  Eat more home-cooked food and less restaurant, buffet, and fast food.  Limit fried foods.  Cook foods using methods other than frying.  Limit canned vegetables. If you do use them, rinse them well to decrease the sodium.  When eating at a restaurant, ask that your food be prepared with less salt, or no salt if possible. WHAT FOODS CAN I EAT? Seek help from a dietitian for individual calorie needs. Grains Whole grain or whole wheat bread. Brown rice. Whole  grain or whole wheat pasta. Quinoa, bulgur, and whole grain cereals. Low-sodium cereals. Corn or whole wheat flour tortillas. Whole grain cornbread. Whole grain crackers. Low-sodium crackers. Vegetables Fresh or frozen vegetables (raw, steamed, roasted, or grilled). Low-sodium or reduced-sodium tomato and vegetable juices. Low-sodium or reduced-sodium tomato sauce and paste. Low-sodium or reduced-sodium canned vegetables.  Fruits All fresh, canned (in natural juice), or frozen fruits. Meat and Other Protein Products Ground beef (85% or leaner), grass-fed beef, or beef trimmed of fat. Skinless chicken or [place]. Ground chicken or [place]. Pork trimmed of fat. All fish and seafood. Eggs. Dried beans, peas, or lentils. Unsalted nuts and seeds. Unsalted canned beans. Dairy Low-fat dairy products, such as skim or 1% milk, 2% or reduced-fat cheeses, low-fat ricotta or cottage cheese, or plain low-fat yogurt. Low-sodium or reduced-sodium cheeses. Fats and Oils Tub margarines without trans fats. Light or reduced-fat mayonnaise and salad dressings (reduced sodium). Avocado. Safflower, olive, or canola oils. Natural peanut or almond butter. Other Unsalted popcorn and pretzels. The items listed above may not be a complete list of recommended foods or beverages. Contact your dietitian for more options. WHAT FOODS ARE NOT RECOMMENDED? Grains White bread. White pasta. White rice. Refined cornbread. Bagels and croissants. Crackers that contain trans fat. Vegetables Creamed or fried vegetables. Vegetables in a cheese sauce. Regular canned vegetables. Regular canned tomato sauce and paste. Regular tomato and vegetable juices. Fruits Dried fruits. Canned fruit in light or heavy syrup. Fruit juice. Meat and Other Protein Products Fatty  cuts of meat. Ribs, chicken wings, bacon, sausage, bologna, salami, chitterlings, fatback, hot dogs, bratwurst, and packaged luncheon meats. Salted nuts and seeds. Canned beans with  salt. Dairy Whole or 2% milk, cream, half-and-half, and cream cheese. Whole-fat or sweetened yogurt. Full-fat cheeses or blue cheese. Nondairy creamers and whipped toppings. Processed cheese, cheese spreads, or cheese curds. Condiments Onion and garlic salt, seasoned salt, table salt, and sea salt. Canned and packaged gravies. Worcestershire sauce. Tartar sauce. Barbecue sauce. Teriyaki sauce. Soy sauce, including reduced sodium. Steak sauce. Fish sauce. Oyster sauce. Cocktail sauce. Horseradish. Ketchup and mustard. Meat flavorings and tenderizers. Bouillon cubes. Hot sauce. Tabasco sauce. Marinades. Taco seasonings. Relishes. Fats and Oils Butter, stick margarine, lard, shortening, ghee, and bacon fat. Coconut, palm kernel, or palm oils. Regular salad dressings. Other Pickles and olives. Salted popcorn and pretzels. The items listed above may not be a complete list of foods and beverages to avoid. Contact your dietitian for more information. WHERE CAN I FIND MORE INFORMATION? National Heart, Lung, and Blood Institute: travelstabloid.com Document Released: 07/10/2011 Document Revised: 12/05/2013 Document Reviewed: 05/25/2013 [facility] Patient Information 2015 [place], [place]. This information is not intended to replace advice given to you by your health care provider. Make sure you discuss any questions you have with your health care provider.

## 2014-09-05 NOTE — Assessment & Plan Note (Signed)
Bunion noted on exam, gets some blisters on tops of toes 2-4 with running at times, we discussed returning to Dr. Oneida Alar to discuss orthotics.

## 2014-09-05 NOTE — Assessment & Plan Note (Signed)
Offered lipids at this time but patient declines. We discussed elevated LDL at 110 but all other #s normal. HDL excellent around 60 regularly. On ASA to lower risk. No statin at present. Calculate 10 year risk with next lipids. Discussed lowering processed foods and restarting exercise.

## 2015-06-26 ENCOUNTER — Encounter: Payer: Self-pay | Admitting: Gastroenterology

## 2015-07-02 ENCOUNTER — Encounter: Payer: Self-pay | Admitting: Family Medicine

## 2015-07-03 ENCOUNTER — Other Ambulatory Visit: Payer: Self-pay | Admitting: Family Medicine

## 2015-07-03 DIAGNOSIS — Z1159 Encounter for screening for other viral diseases: Secondary | ICD-10-CM

## 2015-07-18 ENCOUNTER — Encounter: Payer: Self-pay | Admitting: Family Medicine

## 2015-09-03 ENCOUNTER — Other Ambulatory Visit (INDEPENDENT_AMBULATORY_CARE_PROVIDER_SITE_OTHER): Payer: Managed Care, Other (non HMO)

## 2015-09-03 DIAGNOSIS — Z0000 Encounter for general adult medical examination without abnormal findings: Secondary | ICD-10-CM | POA: Diagnosis not present

## 2015-09-03 DIAGNOSIS — Z1159 Encounter for screening for other viral diseases: Secondary | ICD-10-CM

## 2015-09-03 LAB — LIPID PANEL
CHOL/HDL RATIO: 2
CHOLESTEROL: 218 mg/dL — AB (ref 0–200)
HDL: 92.4 mg/dL (ref >39.00)
LDL CALC: 117 mg/dL — AB (ref 0–99)
NonHDL: 125.23
TRIGLYCERIDES: 43 mg/dL (ref 0.0–149.0)
VLDL: 8.6 mg/dL (ref 0.0–40.0)

## 2015-09-03 LAB — HEPATIC FUNCTION PANEL
ALT: 26 U/L (ref 0–53)
AST: 43 U/L — AB (ref 0–37)
Albumin: 4.4 g/dL (ref 3.5–5.2)
Alkaline Phosphatase: 31 U/L — ABNORMAL LOW (ref 39–117)
BILIRUBIN DIRECT: 0.2 mg/dL (ref 0.0–0.3)
BILIRUBIN TOTAL: 1.1 mg/dL (ref 0.2–1.2)
Total Protein: 7 g/dL (ref 6.0–8.3)

## 2015-09-03 LAB — PSA: PSA: 1.11 ng/mL (ref 0.10–4.00)

## 2015-09-03 LAB — CBC WITH DIFFERENTIAL/PLATELET
BASOS ABS: 0 10*3/uL (ref 0.0–0.1)
Basophils Relative: 0.8 % (ref 0.0–3.0)
EOS ABS: 0.1 10*3/uL (ref 0.0–0.7)
Eosinophils Relative: 2.1 % (ref 0.0–5.0)
HCT: 43.8 % (ref 39.0–52.0)
Hemoglobin: 14.7 g/dL (ref 13.0–17.0)
LYMPHS ABS: 1.6 10*3/uL (ref 0.7–4.0)
Lymphocytes Relative: 43.6 % (ref 12.0–46.0)
MCHC: 33.6 g/dL (ref 30.0–36.0)
MCV: 94.9 fl (ref 78.0–100.0)
MONO ABS: 0.4 10*3/uL (ref 0.1–1.0)
Monocytes Relative: 11.6 % (ref 3.0–12.0)
NEUTROS PCT: 41.9 % — AB (ref 43.0–77.0)
Neutro Abs: 1.6 10*3/uL (ref 1.4–7.7)
Platelets: 191 10*3/uL (ref 150.0–400.0)
RBC: 4.62 Mil/uL (ref 4.22–5.81)
RDW: 13.6 % (ref 11.5–15.5)
WBC: 3.7 10*3/uL — ABNORMAL LOW (ref 4.0–10.5)

## 2015-09-03 LAB — POC URINALSYSI DIPSTICK (AUTOMATED)
BILIRUBIN UA: NEGATIVE
GLUCOSE UA: NEGATIVE
NITRITE UA: NEGATIVE
Protein, UA: NEGATIVE
Spec Grav, UA: 1.02
UROBILINOGEN UA: 0.2
pH, UA: 6

## 2015-09-03 LAB — BASIC METABOLIC PANEL
BUN: 18 mg/dL (ref 6–23)
CALCIUM: 9.4 mg/dL (ref 8.4–10.5)
CO2: 28 mEq/L (ref 19–32)
CREATININE: 0.89 mg/dL (ref 0.40–1.50)
Chloride: 97 mEq/L (ref 96–112)
GFR: 90.06 mL/min (ref >60.00)
GLUCOSE: 95 mg/dL (ref 70–99)
Potassium: 4.5 mEq/L (ref 3.5–5.1)
Sodium: 135 mEq/L (ref 135–145)

## 2015-09-03 LAB — URINALYSIS, MICROSCOPIC ONLY

## 2015-09-03 LAB — TSH: TSH: 2.4 u[IU]/mL (ref 0.35–4.50)

## 2015-09-04 LAB — HEPATITIS C ANTIBODY: HCV AB: NEGATIVE

## 2015-09-10 ENCOUNTER — Encounter: Payer: Managed Care, Other (non HMO) | Admitting: Family Medicine

## 2015-09-20 ENCOUNTER — Ambulatory Visit (INDEPENDENT_AMBULATORY_CARE_PROVIDER_SITE_OTHER): Payer: Managed Care, Other (non HMO) | Admitting: Family Medicine

## 2015-09-20 ENCOUNTER — Encounter: Payer: Self-pay | Admitting: Family Medicine

## 2015-09-20 VITALS — BP 144/88 | HR 81 | Temp 97.8°F | Ht 67.0 in | Wt 154.0 lb

## 2015-09-20 DIAGNOSIS — Z0000 Encounter for general adult medical examination without abnormal findings: Secondary | ICD-10-CM

## 2015-09-20 DIAGNOSIS — Z23 Encounter for immunization: Secondary | ICD-10-CM

## 2015-09-20 NOTE — Progress Notes (Signed)
Thomas Reddish, MD Phone: [phone]  Subjective:  Patient presents today for their annual physical. Chief complaint-noted.   See problem oriented charting- ROS- full  review of systems was completed and negative except for: sleep issues. No chest pain or shortness of breath. No headache or blurry vision.   The following were reviewed and entered/updated in epic: Past Medical History  Diagnosis Date  . Melanoma of skin ([place]) 09/05/2014    Dr. Allyson Sabal. Every 6 month follow up.    Marland Kitchen BPH (benign prostatic hyperplasia) 09/05/2014    Saw palmetto has improved nocturia 3-5x a night to 1-2x a night.    . Bunion of great toe of left foot 10/20/2011    Hallux valgus shift on left with some early changes starting on RT    Patient Active Problem List   Diagnosis Date Noted  . Melanoma of skin ([place]) 09/05/2014    Priority: High  . BPH (benign prostatic hyperplasia) 09/05/2014    Priority: Medium  . Elevated blood pressure 09/05/2014    Priority: Medium  . Hyperlipidemia 09/05/2014    Priority: Medium  . Bunion of great toe of left foot 10/20/2011    Priority: Low   Past Surgical History  Procedure Laterality Date  . Melanoma excision      Family History  Problem Relation Age of Onset  . Melanoma Father   . Alzheimer's disease Mother     lived to 47  . Alzheimer's disease Father     lived to 5  . Anxiety disorder Sister     agoraphobia    Medications- reviewed and updated Current Outpatient Prescriptions  Medication Sig Dispense Refill  . Ascorbic Acid (VITAMIN C) 1000 MG tablet Take 1,000 mg by mouth daily. 1-3 qd     . aspirin 325 MG tablet Take 325 mg by mouth daily.      . Calcium Carbonate-Vitamin D (CALCIUM-VITAMIN D) 500-200 MG-UNIT per tablet Take 1 tablet by mouth daily.      . Glucosamine-Chondroit-Vit C-Mn (GLUCOSAMINE CHONDR 1500 COMPLX) CAPS Take 1 capsule by mouth daily.    . Multiple Vitamin (MULTIVITAMIN) tablet Take 1 tablet by mouth daily.      .  multivitamin-lutein (OCUVITE-LUTEIN) CAPS Take 1 capsule by mouth daily.    . OMEGA-3 1000 MG CAPS Take 4 capsules by mouth daily.      . Saw Palmetto, Serenoa repens, 1000 MG CAPS Take by mouth 1 day or 1 dose.      No current facility-administered medications for this visit.    Allergies-reviewed and updated Allergies  Allergen Reactions  . Doxycycline     Social History   Social History  . Marital Status: Married    Spouse Name: N/A  . Number of Children: N/A  . Years of Education: N/A   Social History Main Topics  . Smoking status: Former Smoker -- 0.75 packs/day for 5 years    Types: Cigarettes    Quit date: 08/05/1975  . Smokeless tobacco: Never Used     Comment: ONLY SMOKED FOR 5 Y EARS  . Alcohol Use: 6.0 - 8.4 oz/week    10-14 Standard drinks or equivalent per week  . Drug Use: No  . Sexual Activity: Yes   Other Topics Concern  . None   Social History Narrative   Married 1975 (Wife with another Conservation officer, historic buildings). 3 daughters [phone] in 2016). 2 granddaughters (only youngest married)      Works in [place] for [employer]	[place]. Runs tax  group.       Hobbies: running (huge passion runs marathons), biking, gardening, occasional golf    ROS--See HPI   Objective: BP 144/88 mmHg  Pulse 81  Temp(Src) 97.8 F (36.6 C)  Ht 5\' 7"  (1.702 m)  Wt 154 lb (69.854 kg)  BMI 24.11 kg/m2 Gen: NAD, resting comfortably HEENT: Mucous membranes are moist. Oropharynx normal Neck: no thyromegaly CV: RRR no murmurs rubs or gallops Lungs: CTAB no crackles, wheeze, rhonchi Abdomen: soft/nontender/nondistended/normal bowel sounds. No rebound or guarding.  Ext: no edema Rectal: normal tone, diffusely enlarged prostate, no masses or tenderness Skin: warm, dry, no rash Neuro: grossly normal, moves all extremities, PERRLA  Assessment/Plan:  70 y.o. male presenting for annual physical.  Health Maintenance counseling: 1. Anticipatory guidance: Patient counseled  regarding regular dental exams, eye exams, wearing seatbelts.  2. Risk factor reduction:  Advised patient of need for regular exercise (down slightly due to work, trying to bump back up- marathon in October, half in [employer] rich and fruits and vegetables to reduce risk of heart attack and stroke (recent eating out more as working more in [place]) 3. Immunizations/screenings/ancillary studies Health Maintenance Due  Topic Date Due  . TETANUS/TDAP -today 08/05/2015   4. Prostate cancer screening- low risk based off rectal and PSA  Lab Results  Component Value Date   PSA 1.11 09/03/2015   PSA 1.11 10/11/2012   PSA 1.18 10/27/2011   5. Colon cancer screening - 10/28/2007 normal with 10 year repeat 6. Skin cancer screening- Dr. Allyson Sabal regular visits  Elevated blood pressure- technically hypertension. Has been running around 140s/80s at homes- higher recently due to stress. Patien twants to monitor for now but if over 150 would start meds, if over 140 after retiring start meds. Leans towards amlodipine. Worried about kidney with lisinopril and with some bph symptoms worried about hctz BP Readings from Last 3 Encounters:  09/20/15 144/88  09/05/14 150/90  04/20/13 136/80   Sleep issues in last 3 weeks has been difficult. No problem falling asleep. Wake up around 4-5 hours later to urinate then goes back to sleep but sometimes doesn't. 7-8 hours in bed but probably a few hours less than this. Takes unisom nightly. Considered ambien  Hyperlipidemia 12% risk if using 120 BP which obviously his risk is higher. Declines statin. Running down some so he is going to bump that and improve diet and recheck in a year. Very high HDL at 90 fortunately.   Return in about 6 months (around 03/19/2016) for follow up on blood pressure- or sooner if needed. Return precautions advised.   Tdap given today

## 2015-09-20 NOTE — Patient Instructions (Addendum)
Update Tdap today  Your blood pressure trend concerns me. I would like for you to buy/use a home cuff to check at least 4x a week. Your goal is <150/90 but really under 140/90 is preferred. If you note in thecoming weeks that it is over 150, I want to see you or call in blood pressure medicine. if it is under 150, ok with monitoring until you retire but in long run would prefer under 140/90 as stated  Otherwise, I think you are doing great

## 2016-03-19 ENCOUNTER — Ambulatory Visit (INDEPENDENT_AMBULATORY_CARE_PROVIDER_SITE_OTHER): Payer: Medicare Other | Admitting: Family Medicine

## 2016-03-19 ENCOUNTER — Encounter: Payer: Self-pay | Admitting: Family Medicine

## 2016-03-19 VITALS — BP 150/88 | HR 65 | Wt 151.0 lb

## 2016-03-19 DIAGNOSIS — Z23 Encounter for immunization: Secondary | ICD-10-CM | POA: Diagnosis not present

## 2016-03-19 DIAGNOSIS — E785 Hyperlipidemia, unspecified: Secondary | ICD-10-CM | POA: Diagnosis not present

## 2016-03-19 DIAGNOSIS — I10 Essential (primary) hypertension: Secondary | ICD-10-CM

## 2016-03-19 NOTE — Patient Instructions (Signed)
Would really prefer to keep your blood pressure <140/90 given overall health. Technically we could use 150 goal over age 70 but I would prefer to be more aggressive tor reduce risk of heart attack or stroke   I would like for you to use a home cuff to check at least 4x a week.  If you note in the next few weeks that it is higher than our goal, see me sooner. Otherwise, see me in November. Bring your home cuff and your log of blood pressures for at least a month with you to visit.   Will check on flu shot  DASH Eating Plan DASH stands for "Dietary Approaches to Stop Hypertension." The DASH eating plan is a healthy eating plan that has been shown to reduce high blood pressure (hypertension). Additional health benefits may include reducing the risk of type 2 diabetes mellitus, heart disease, and stroke. The DASH eating plan may also help with weight loss. WHAT DO I NEED TO KNOW ABOUT THE DASH EATING PLAN? For the DASH eating plan, you will follow these general guidelines:  Choose foods with a percent daily value for sodium of less than 5% (as listed on the food label).  Use salt-free seasonings or herbs instead of table salt or sea salt.  Check with your health care provider or pharmacist before using salt substitutes.  Eat lower-sodium products, often labeled as "lower sodium" or "no salt added."  Eat fresh foods.  Eat more vegetables, fruits, and low-fat dairy products.  Choose whole grains. Look for the word "whole" as the first word in the ingredient list.  Choose fish and skinless chicken or [place] more often than red meat. Limit fish, poultry, and meat to 6 oz (170 g) each day.  Limit sweets, desserts, sugars, and sugary drinks.  Choose heart-healthy fats.  Limit cheese to 1 oz (28 g) per day.  Eat more home-cooked food and less restaurant, buffet, and fast food.  Limit fried foods.  Cook foods using methods other than frying.  Limit canned vegetables. If you do use them,  rinse them well to decrease the sodium.  When eating at a restaurant, ask that your food be prepared with less salt, or no salt if possible. WHAT FOODS CAN I EAT? Seek help from a dietitian for individual calorie needs. Grains Whole grain or whole wheat bread. Brown rice. Whole grain or whole wheat pasta. Quinoa, bulgur, and whole grain cereals. Low-sodium cereals. Corn or whole wheat flour tortillas. Whole grain cornbread. Whole grain crackers. Low-sodium crackers. Vegetables Fresh or frozen vegetables (raw, steamed, roasted, or grilled). Low-sodium or reduced-sodium tomato and vegetable juices. Low-sodium or reduced-sodium tomato sauce and paste. Low-sodium or reduced-sodium canned vegetables.  Fruits All fresh, canned (in natural juice), or frozen fruits. Meat and Other Protein Products Ground beef (85% or leaner), grass-fed beef, or beef trimmed of fat. Skinless chicken or [place]. Ground chicken or [place]. Pork trimmed of fat. All fish and seafood. Eggs. Dried beans, peas, or lentils. Unsalted nuts and seeds. Unsalted canned beans. Dairy Low-fat dairy products, such as skim or 1% milk, 2% or reduced-fat cheeses, low-fat ricotta or cottage cheese, or plain low-fat yogurt. Low-sodium or reduced-sodium cheeses. Fats and Oils Tub margarines without trans fats. Light or reduced-fat mayonnaise and salad dressings (reduced sodium). Avocado. Safflower, olive, or canola oils. Natural peanut or almond butter. Other Unsalted popcorn and pretzels. The items listed above may not be a complete list of recommended foods or beverages. Contact your dietitian for more  options. WHAT FOODS ARE NOT RECOMMENDED? Grains White bread. White pasta. White rice. Refined cornbread. Bagels and croissants. Crackers that contain trans fat. Vegetables Creamed or fried vegetables. Vegetables in a cheese sauce. Regular canned vegetables. Regular canned tomato sauce and paste. Regular tomato and vegetable  juices. Fruits Dried fruits. Canned fruit in light or heavy syrup. Fruit juice. Meat and Other Protein Products Fatty cuts of meat. Ribs, chicken wings, bacon, sausage, bologna, salami, chitterlings, fatback, hot dogs, bratwurst, and packaged luncheon meats. Salted nuts and seeds. Canned beans with salt. Dairy Whole or 2% milk, cream, half-and-half, and cream cheese. Whole-fat or sweetened yogurt. Full-fat cheeses or blue cheese. Nondairy creamers and whipped toppings. Processed cheese, cheese spreads, or cheese curds. Condiments Onion and garlic salt, seasoned salt, table salt, and sea salt. Canned and packaged gravies. Worcestershire sauce. Tartar sauce. Barbecue sauce. Teriyaki sauce. Soy sauce, including reduced sodium. Steak sauce. Fish sauce. Oyster sauce. Cocktail sauce. Horseradish. Ketchup and mustard. Meat flavorings and tenderizers. Bouillon cubes. Hot sauce. Tabasco sauce. Marinades. Taco seasonings. Relishes. Fats and Oils Butter, stick margarine, lard, shortening, ghee, and bacon fat. Coconut, palm kernel, or palm oils. Regular salad dressings. Other Pickles and olives. Salted popcorn and pretzels. The items listed above may not be a complete list of foods and beverages to avoid. Contact your dietitian for more information. WHERE CAN I FIND MORE INFORMATION? National Heart, Lung, and Blood Institute: travelstabloid.com   This information is not intended to replace advice given to you by your health care provider. Make sure you discuss any questions you have with your health care provider.   Document Released: 07/10/2011 Document Revised: 08/11/2014 Document Reviewed: 05/25/2013 Elsevier Interactive Patient Education [employer].

## 2016-03-19 NOTE — Assessment & Plan Note (Signed)
S: mild poorly controlled on no meds. No myalgias. wantd to work on increasing running again. 12% ascvd risk.  Lab Results  Component Value Date   CHOL 218 (H) 09/03/2015   HDL 92.40 09/03/2015   LDLCALC 117 (H) 09/03/2015   LDLDIRECT 100.5 10/27/2011   TRIG 43.0 09/03/2015   CHOLHDL 2 09/03/2015   A/P: discussed risk would likely go down if BP lower which we are working to determine through home monitoring and verifying home cuff.

## 2016-03-19 NOTE — Assessment & Plan Note (Signed)
S: poorly controlled in office but. Home readings have been range from 120s to 130s/70s to 80s. No numbers over 140/90. 154/92 on my repeat. Home cuff not present today.  BP Readings from Last 3 Encounters:  03/19/16 (!) 150/88  09/20/15 (!) 144/88  09/05/14 (!) 150/90  A/P:Continue without meds. Discussed amlodipine option. Worries about kidney with lisinopril and bph worries about hctz. Discussed 1 motnh follow up- he prefers November after his marathon before considering medications. Suspect this may be white coat but will need to confirm with home cuff verification. See avs instructions. Focus on dash eating plan. Extended counseling- a lot of anxiety about this and suspect contributes to BP as increased on my follow up repeat BP.

## 2016-03-19 NOTE — Progress Notes (Signed)
Subjective:  Thomas Craig is a 70 y.o. year old very pleasant male patient who presents for/with See problem oriented charting ROS- No chest pain or shortness of breath. No headache or blurry vision- all of these even despite avid running.see any ROS included in HPI as well.   Past Medical History-  Patient Active Problem List   Diagnosis Date Noted  . Melanoma of skin ([place]) 09/05/2014    Priority: High  . BPH (benign prostatic hyperplasia) 09/05/2014    Priority: Medium  . Essential hypertension 09/05/2014    Priority: Medium  . Hyperlipidemia 09/05/2014    Priority: Medium  . Bunion of great toe of left foot 10/20/2011    Priority: Low    Medications- reviewed and updated Current Outpatient Prescriptions  Medication Sig Dispense Refill  . Ascorbic Acid (VITAMIN C) 1000 MG tablet Take 1,000 mg by mouth daily. 1-3 qd     . aspirin 325 MG tablet Take 325 mg by mouth daily.      Marland Kitchen b complex vitamins capsule Take 1 capsule by mouth daily.    . Calcium Carbonate-Vitamin D (CALCIUM-VITAMIN D) 500-200 MG-UNIT per tablet Take 1 tablet by mouth daily.      . Glucosamine-Chondroit-Vit C-Mn (GLUCOSAMINE CHONDR 1500 COMPLX) CAPS Take 1 capsule by mouth daily.    . Multiple Vitamin (MULTIVITAMIN) tablet Take 1 tablet by mouth daily.      . multivitamin-lutein (OCUVITE-LUTEIN) CAPS Take 1 capsule by mouth daily.    . OMEGA-3 1000 MG CAPS Take 4 capsules by mouth daily.      . Saw Palmetto, Serenoa repens, 1000 MG CAPS Take by mouth 1 day or 1 dose.      No current facility-administered medications for this visit.     Objective: BP (!) 150/88   Pulse 65   Wt 151 lb (68.5 kg)   SpO2 97%   BMI 23.65 kg/m   BP similar both arms.  Gen: NAD, resting comfortably CV: RRR no murmurs rubs or gallops Lungs: CTAB no crackles, wheeze, rhonchi Abdomen: soft/nontender/nondistended/normal bowel sounds. No rebound or guarding.  Ext: no edema Skin: warm, dry Neuro: grossly normal, moves all  extremities  Assessment/Plan:  Essential hypertension S: poorly controlled in office but. Home readings have been range from 120s to 130s/70s to 80s. No numbers over 140/90. 154/92 on my repeat. Home cuff not present today.  BP Readings from Last 3 Encounters:  03/19/16 (!) 150/88  09/20/15 (!) 144/88  09/05/14 (!) 150/90  A/P:Continue without meds. Discussed amlodipine option. Worries about kidney with lisinopril and bph worries about hctz. Discussed 1 motnh follow up- he prefers November after his marathon before considering medications. Suspect this may be white coat but will need to confirm with home cuff verification. See avs instructions. Focus on dash eating plan. Extended counseling- a lot of anxiety about this and suspect contributes to BP as increased on my follow up repeat BP.   Hyperlipidemia S: mild poorly controlled on no meds. No myalgias. wantd to work on increasing running again. 12% ascvd risk.  Lab Results  Component Value Date   CHOL 218 (H) 09/03/2015   HDL 92.40 09/03/2015   LDLCALC 117 (H) 09/03/2015   LDLDIRECT 100.5 10/27/2011   TRIG 43.0 09/03/2015   CHOLHDL 2 09/03/2015   A/P: discussed risk would likely go down if BP lower which we are working to determine through home monitoring and verifying home cuff.   november  Orders Placed This Encounter  Procedures  .  Flu Vaccine QUAD 36+ mos IM    Meds ordered this encounter  Medications  . b complex vitamins capsule    Sig: Take 1 capsule by mouth daily.   The duration of face-to-face time during this visit was 35 minutes. Greater than 50% of this time was spent in counseling, explanation of diagnosis, planning of further management, and/or coordination of care.    Return precautions advised.  Garret Reddish, MD

## 2016-04-27 ENCOUNTER — Encounter: Payer: Self-pay | Admitting: Family Medicine

## 2016-04-28 ENCOUNTER — Encounter: Payer: Self-pay | Admitting: Family Medicine

## 2016-04-28 ENCOUNTER — Ambulatory Visit (INDEPENDENT_AMBULATORY_CARE_PROVIDER_SITE_OTHER): Payer: Medicare Other | Admitting: Family Medicine

## 2016-04-28 VITALS — BP 164/78 | HR 90 | Temp 98.4°F | Wt 150.8 lb

## 2016-04-28 DIAGNOSIS — I10 Essential (primary) hypertension: Secondary | ICD-10-CM | POA: Diagnosis not present

## 2016-04-28 DIAGNOSIS — L814 Other melanin hyperpigmentation: Secondary | ICD-10-CM | POA: Diagnosis not present

## 2016-04-28 DIAGNOSIS — D235 Other benign neoplasm of skin of trunk: Secondary | ICD-10-CM | POA: Diagnosis not present

## 2016-04-28 DIAGNOSIS — L821 Other seborrheic keratosis: Secondary | ICD-10-CM | POA: Diagnosis not present

## 2016-04-28 DIAGNOSIS — L820 Inflamed seborrheic keratosis: Secondary | ICD-10-CM | POA: Diagnosis not present

## 2016-04-28 DIAGNOSIS — L84 Corns and callosities: Secondary | ICD-10-CM | POA: Diagnosis not present

## 2016-04-28 DIAGNOSIS — R310 Gross hematuria: Secondary | ICD-10-CM

## 2016-04-28 DIAGNOSIS — R319 Hematuria, unspecified: Secondary | ICD-10-CM

## 2016-04-28 DIAGNOSIS — Z85820 Personal history of malignant melanoma of skin: Secondary | ICD-10-CM | POA: Diagnosis not present

## 2016-04-28 DIAGNOSIS — L570 Actinic keratosis: Secondary | ICD-10-CM | POA: Diagnosis not present

## 2016-04-28 DIAGNOSIS — D1801 Hemangioma of skin and subcutaneous tissue: Secondary | ICD-10-CM | POA: Diagnosis not present

## 2016-04-28 LAB — BASIC METABOLIC PANEL
BUN: 18 mg/dL (ref 6–23)
CALCIUM: 9.3 mg/dL (ref 8.4–10.5)
CO2: 31 mEq/L (ref 19–32)
CREATININE: 0.9 mg/dL (ref 0.40–1.50)
Chloride: 98 mEq/L (ref 96–112)
GFR: 88.73 mL/min (ref >60.00)
Glucose, Bld: 91 mg/dL (ref 70–99)
Potassium: 4.4 mEq/L (ref 3.5–5.1)
Sodium: 137 mEq/L (ref 135–145)

## 2016-04-28 LAB — POC URINALSYSI DIPSTICK (AUTOMATED)
BILIRUBIN UA: NEGATIVE
GLUCOSE UA: NEGATIVE
KETONES UA: NEGATIVE
Nitrite, UA: NEGATIVE
Protein, UA: NEGATIVE
SPEC GRAV UA: 1.015
Urobilinogen, UA: 0.2
pH, UA: 6.5

## 2016-04-28 LAB — CBC
HEMATOCRIT: 40.3 % (ref 39.0–52.0)
HEMOGLOBIN: 13.8 g/dL (ref 13.0–17.0)
MCHC: 34.1 g/dL (ref 30.0–36.0)
MCV: 93.9 fl (ref 78.0–100.0)
PLATELETS: 193 10*3/uL (ref 150.0–400.0)
RBC: 4.29 Mil/uL (ref 4.22–5.81)
RDW: 13.3 % (ref 11.5–15.5)
WBC: 5.2 10*3/uL (ref 4.0–10.5)

## 2016-04-28 MED ORDER — AMLODIPINE BESYLATE 2.5 MG PO TABS: 2.5000 mg | ORAL_TABLET | Freq: Every day | ORAL | 5 refills | Status: DC

## 2016-04-28 NOTE — Patient Instructions (Signed)
Bloodwork before you leave. We are getting urine culture to rule out infection.   We will call you within a week about your referral to urology. If you do not hear within 2 weeks, give [place] a call. It may be a little while before you get in.   Start amlodipine 2.5mg . Let me know if pressures still over 140 after 2-3 weeks and I may bump you to 5mg  but want to go slow

## 2016-04-28 NOTE — Assessment & Plan Note (Signed)
S: poor control at times at home. Mainly 130s and 140s with occasional 150. This AM was 167 after coffee- usually higher in AMsd BP Readings from Last 3 Encounters:  04/28/16 (!) 164/78  03/19/16 (!) 150/88  09/20/15 (!) 144/88  A/P: will start low at amlodipine 2.5mg  and titrate slowly. Hopeful all home readings under 140 instead of averaging more in 140 range. He is a runner and has upcoming marathon- in addition he will let me know in next 2-3 weeks and can titrate up further as needed.

## 2016-04-28 NOTE — Progress Notes (Signed)
Subjective:  Thomas Craig is a 70 y.o. year old very pleasant male patient who presents for/with See problem oriented charting ROS- No chest pain or shortness of breath. No headache or blurry vision. .see any ROS included in HPI as well.   Past Medical History-  Patient Active Problem List   Diagnosis Date Noted  . Melanoma of skin ([place]) 09/05/2014    Priority: High  . BPH (benign prostatic hyperplasia) 09/05/2014    Priority: Medium  . Essential hypertension 09/05/2014    Priority: Medium  . Hyperlipidemia 09/05/2014    Priority: Medium  . Bunion of great toe of left foot 10/20/2011    Priority: Low    Medications- reviewed and updated Current Outpatient Prescriptions  Medication Sig Dispense Refill  . Ascorbic Acid (VITAMIN C) 1000 MG tablet Take 1,000 mg by mouth daily. 1-3 qd     . aspirin 325 MG tablet Take 325 mg by mouth daily.      Marland Kitchen b complex vitamins capsule Take 1 capsule by mouth daily.    . Calcium Carbonate-Vitamin D (CALCIUM-VITAMIN D) 500-200 MG-UNIT per tablet Take 1 tablet by mouth daily.      . cyanocobalamin 500 MCG tablet Take 500 mcg by mouth daily.    . Glucosamine-Chondroit-Vit C-Mn (GLUCOSAMINE CHONDR 1500 COMPLX) CAPS Take 1 capsule by mouth daily.    . Multiple Vitamin (MULTIVITAMIN) tablet Take 1 tablet by mouth daily.      . multivitamin-lutein (OCUVITE-LUTEIN) CAPS Take 1 capsule by mouth daily.    . OMEGA-3 1000 MG CAPS Take 4 capsules by mouth daily.      . Saw Palmetto, Serenoa repens, 1000 MG CAPS Take by mouth 1 day or 1 dose.      No current facility-administered medications for this visit.     Objective: BP (!) 164/78   Pulse 90   Temp 98.4 F (36.9 C) (Oral)   Wt 150 lb 12.8 oz (68.4 kg)   SpO2 98%   BMI 23.62 kg/m  Gen: NAD, resting comfortably CV: RRR no murmurs rubs or gallops Lungs: CTAB no crackles, wheeze, rhonchi Abdomen: soft/nontender including no suprapubic pain (also no CVA pain)/nondistended/normal bowel sounds.  No rebound or guarding.  Ext: no edema Skin: warm, dry  Assessment/Plan:  Essential hypertension S: poor control at times at home. Mainly 130s and 140s with occasional 150. This AM was 167 after coffee- usually higher in AMsd BP Readings from Last 3 Encounters:  04/28/16 (!) 164/78  03/19/16 (!) 150/88  09/20/15 (!) 144/88  A/P: will start low at amlodipine 2.5mg  and titrate slowly. Hopeful all home readings under 140 instead of averaging more in 140 range. He is a runner and has upcoming marathon- in addition he will let me know in next 2-3 weeks and can titrate up further as needed.   Hematuria - Plan: POCT Urinalysis Dipstick (Automated) S: noted blood in his urine when he woke up yesterday morning- grossly bloody. Drank a lot of water and continued to be red through about midday then started to lighten up- back to normal by late afternoon and remains normal today.  On dipstick trace leukocytes and 2+ blood. Former smoker 3.75 pack years quit in 1977.  ROS- no dysuria, penile discharge. No blood in semen. No increase in nocturia or polyuria.  A/P: 70 year old former smoker (but only 3.75 pack years quitting 1977). WIth age though believe urology evaluation warranted. Will get urine culture and labs today though. Known BPH- friable vessels?  Kidney stone? May need cystoscopy though.   November CPE for BP recheck  Orders Placed This Encounter  Procedures  . Urine culture    [facility]  . CBC    [facility]  . Basic metabolic panel    [place]  . Ambulatory referral to Urology    Referral Priority:   Routine    Referral Type:   Consultation    Referral Reason:   Specialty Services Required    Requested Specialty:   Urology    Number of Visits Requested:   1  . POCT Urinalysis Dipstick (Automated)    Meds ordered this encounter  Medications  . cyanocobalamin 500 MCG tablet    Sig: Take 500 mcg by mouth daily.  Marland Kitchen amLODipine (NORVASC) 2.5 MG tablet    Sig: Take 1 tablet (2.5 mg total)  by mouth daily.    Dispense:  30 tablet    Refill:  5    Return precautions advised.  Garret Reddish, MD

## 2016-04-30 LAB — URINE CULTURE: Organism ID, Bacteria: NO GROWTH

## 2016-06-13 DIAGNOSIS — R350 Frequency of micturition: Secondary | ICD-10-CM | POA: Diagnosis not present

## 2016-06-13 DIAGNOSIS — R351 Nocturia: Secondary | ICD-10-CM | POA: Diagnosis not present

## 2016-06-13 DIAGNOSIS — R310 Gross hematuria: Secondary | ICD-10-CM | POA: Diagnosis not present

## 2016-06-19 ENCOUNTER — Ambulatory Visit (INDEPENDENT_AMBULATORY_CARE_PROVIDER_SITE_OTHER): Payer: Medicare Other | Admitting: Family Medicine

## 2016-06-19 ENCOUNTER — Encounter: Payer: Self-pay | Admitting: Family Medicine

## 2016-06-19 VITALS — BP 148/84 | HR 91 | Temp 97.8°F | Wt 152.8 lb

## 2016-06-19 DIAGNOSIS — R319 Hematuria, unspecified: Secondary | ICD-10-CM | POA: Diagnosis not present

## 2016-06-19 DIAGNOSIS — Z87448 Personal history of other diseases of urinary system: Secondary | ICD-10-CM | POA: Insufficient documentation

## 2016-06-19 DIAGNOSIS — I10 Essential (primary) hypertension: Secondary | ICD-10-CM | POA: Diagnosis not present

## 2016-06-19 MED ORDER — AMLODIPINE BESYLATE 2.5 MG PO TABS: 2.5000 mg | ORAL_TABLET | Freq: Every day | ORAL | 3 refills | Status: DC

## 2016-06-19 NOTE — Progress Notes (Signed)
Pre visit review using our clinic review tool, if applicable. No additional management support is needed unless otherwise documented below in the visit note. 

## 2016-06-19 NOTE — Assessment & Plan Note (Addendum)
S: controlled poorly in office but great at home on amlodipine 2.5mg . Home #s had been 130s/140s previously- now mainly 110s to 120s with some in Q000111Q and all diastolic controlled BP Readings from Last 3 Encounters:  06/19/16 (!) 148/84  04/28/16 (!) 164/78  03/19/16 (!) 150/88  A/P:Continue current meds:  Suspect white coat element- bring home cuff for validation next visit. He already has dash handout and has been working on salt intake. Still running- recently did a marathon

## 2016-06-19 NOTE — Progress Notes (Signed)
Subjective:  Thomas Craig is a 70 y.o. year old very pleasant male patient who presents for/with See problem oriented charting ROS- No chest pain or shortness of breath. No headache or blurry vision. .see any ROS included in HPI as well.   Past Medical History-  Patient Active Problem List   Diagnosis Date Noted  . Melanoma of skin ([place]) 09/05/2014    Priority: High  . BPH (benign prostatic hyperplasia) 09/05/2014    Priority: Medium  . Essential hypertension 09/05/2014    Priority: Medium  . Hyperlipidemia 09/05/2014    Priority: Medium  . Bunion of great toe of left foot 10/20/2011    Priority: Low  . Hematuria 06/19/2016    Medications- reviewed and updated Current Outpatient Prescriptions  Medication Sig Dispense Refill  . amLODipine (NORVASC) 2.5 MG tablet Take 1 tablet (2.5 mg total) by mouth daily. 30 tablet 5  . Ascorbic Acid (VITAMIN C) 1000 MG tablet Take 1,000 mg by mouth daily. 1-3 qd     . aspirin 325 MG tablet Take 325 mg by mouth daily.      Marland Kitchen b complex vitamins capsule Take 1 capsule by mouth daily.    . Calcium Carbonate-Vitamin D (CALCIUM-VITAMIN D) 500-200 MG-UNIT per tablet Take 1 tablet by mouth daily.      . cyanocobalamin 500 MCG tablet Take 500 mcg by mouth daily.    . Glucosamine-Chondroit-Vit C-Mn (GLUCOSAMINE CHONDR 1500 COMPLX) CAPS Take 1 capsule by mouth daily.    . Multiple Vitamin (MULTIVITAMIN) tablet Take 1 tablet by mouth daily.      . multivitamin-lutein (OCUVITE-LUTEIN) CAPS Take 1 capsule by mouth daily.    . OMEGA-3 1000 MG CAPS Take 4 capsules by mouth daily.      . Saw Palmetto, Serenoa repens, 1000 MG CAPS Take by mouth 1 day or 1 dose.      No current facility-administered medications for this visit.     Objective: BP (!) 148/84 (BP Location: Left Arm, Patient Position: Sitting, Cuff Size: Large)   Pulse 91   Temp 97.8 F (36.6 C) (Oral)   Wt 152 lb 12.8 oz (69.3 kg)   SpO2 91%   BMI 23.93 kg/m  Gen: NAD, resting  comfortably CV: RRR no murmurs rubs or gallops Lungs: CTAB no crackles, wheeze, rhonchi.  Ext: no edema Skin: warm, dry  Assessment/Plan:  Essential hypertension S: controlled poorly in office but great at home on amlodipine 2.5mg . Home #s had been 130s/140s previously- now mainly 110s to 120s with some in Q000111Q and all diastolic controlled BP Readings from Last 3 Encounters:  06/19/16 (!) 148/84  04/28/16 (!) 164/78  03/19/16 (!) 150/88  A/P:Continue current meds:  Suspect white coat element- bring home cuff for validation next visit. He already has dash handout and has been working on salt intake. Still running- recently did a marathon  Hematuria S:We also spent some time counseling on hematuria. Patient saw urology but declined urology hematuria workup including cystoscopy and CT scan. He was told risk of malignancy was 2% and opted out A/P: aware of malignancy risks- declines workup unless recurrent.  we will check for urine microscopic yearly at least  6 months  Return precautions advised.  Garret Reddish, MD

## 2016-06-19 NOTE — Assessment & Plan Note (Addendum)
S:We also spent some time counseling on hematuria. Patient saw urology but declined urology hematuria workup including cystoscopy and CT scan. He was told risk of malignancy was 2% and opted out A/P: aware of malignancy risks- declines workup unless recurrent.  we will check for urine microscopic yearly at least

## 2016-06-19 NOTE — Patient Instructions (Addendum)
Blood pressure looks better at home- let's target at least <135/85 on home readings though new guidelines push this to 130/80.   6 month recheck - should be time for physical

## 2016-06-19 NOTE — Addendum Note (Signed)
Addended by: Marin Olp on: 06/19/2016 03:24 PM   Modules accepted: Orders

## 2016-10-27 ENCOUNTER — Ambulatory Visit: Payer: Medicare Other | Admitting: Sports Medicine

## 2016-10-30 DIAGNOSIS — D225 Melanocytic nevi of trunk: Secondary | ICD-10-CM | POA: Diagnosis not present

## 2016-10-30 DIAGNOSIS — Z85820 Personal history of malignant melanoma of skin: Secondary | ICD-10-CM | POA: Diagnosis not present

## 2016-10-30 DIAGNOSIS — L814 Other melanin hyperpigmentation: Secondary | ICD-10-CM | POA: Diagnosis not present

## 2016-10-30 DIAGNOSIS — L570 Actinic keratosis: Secondary | ICD-10-CM | POA: Diagnosis not present

## 2016-10-30 DIAGNOSIS — L821 Other seborrheic keratosis: Secondary | ICD-10-CM | POA: Diagnosis not present

## 2016-11-07 ENCOUNTER — Ambulatory Visit (INDEPENDENT_AMBULATORY_CARE_PROVIDER_SITE_OTHER): Payer: Medicare Other | Admitting: Family Medicine

## 2016-11-07 VITALS — BP 142/70 | Ht 68.0 in | Wt 150.0 lb

## 2016-11-07 DIAGNOSIS — M25571 Pain in right ankle and joints of right foot: Secondary | ICD-10-CM | POA: Diagnosis present

## 2016-11-10 NOTE — Progress Notes (Signed)
  MARKEIS ALLMAN - 71 y.o. male MRN [id number]  Date of birth: Jun 10, 1946  SUBJECTIVE:  Including CC & ROS.   Mr. Dehner is a 71 yo M that is presenting with right ankle pain. The pain has been acute on chronic in nature. Denies any inciting event. Reports no swelling or bruising. Had improvement of pain with going to a massage therapist. He denies any medication use or PT. Pain is worse at the end of the day. Occurring on the lateral aspect of his ankle. Has been stiff in the morning. Tried ice and compression. Has had improvement in his pain. He is a runner and has increased his mileage recently.   ROS: No unexpected weight loss, fever, chills, swelling, instability, muscle pain, numbness/tingling, redness, otherwise see HPI    HISTORY: Past Medical, Surgical, Social, and Family History Reviewed & Updated per EMR.   Pertinent Historical Findings include: PMSHx -  BPH, HLD PSHx -  Former smoker  PHYSICAL EXAM:  VS: BP:(!) 142/70  HR: bpm  TEMP: ( )  RESP:   HT:5\' 8"  (172.7 cm)   WT:150 lb (68 kg)  BMI:22.9 PHYSICAL EXAM: Gen: NAD, alert, cooperative with exam, well-appearing HEENT: clear conjunctiva, EOMI CV:  no edema, capillary refill brisk,  Resp: non-labored, normal speech Skin: no rashes, normal turgor  Neuro: no gross deficits.  Psych:  alert and oriented Ankle & Foot: No visible swelling, ecchymosis, erythema, ulcers, calluses, blister Flattened transverse arch  No pain at MT heads No pain at base of 5th MT; No tenderness over cuboid; No tenderness over N spot or navicular prominence No tenderness on lateral and medial malleolus No sign of peroneal tendon subluxations or tenderness to palpation Full in plantarflexion, dorsiflexion, inversion, and eversion of the foot; flexion and extension of the toes Strength: 5/5 in all directions. Sensation: intact Vascular: intact w/ dorsalis pedis & posterior tibialis pulses 2+ Negative anterior drawer  Instability with one  leg testing b/l  Neurovascularly intact     ASSESSMENT & PLAN:   Acute right ankle pain Symptoms are suggestive of chronic ankle instability. Has problem with proprioception.  - provided with HEP  - can use body helix as needed - can continue to run  - f/u PRN

## 2016-11-10 NOTE — Assessment & Plan Note (Signed)
Symptoms are suggestive of chronic ankle instability. Has problem with proprioception.  - provided with HEP  - can use body helix as needed - can continue to run  - f/u PRN

## 2016-12-12 ENCOUNTER — Telehealth: Payer: Self-pay | Admitting: Family Medicine

## 2016-12-12 NOTE — Telephone Encounter (Signed)
Called to see if pt wanted to schedule awv - left message ( will be here 5/17 - awv at 10? )

## 2016-12-18 ENCOUNTER — Encounter: Payer: Self-pay | Admitting: Family Medicine

## 2016-12-18 ENCOUNTER — Ambulatory Visit (INDEPENDENT_AMBULATORY_CARE_PROVIDER_SITE_OTHER): Payer: Medicare Other | Admitting: Family Medicine

## 2016-12-18 VITALS — BP 160/82 | HR 76 | Temp 97.7°F | Ht 67.0 in | Wt 151.0 lb

## 2016-12-18 DIAGNOSIS — N401 Enlarged prostate with lower urinary tract symptoms: Secondary | ICD-10-CM | POA: Diagnosis not present

## 2016-12-18 DIAGNOSIS — R319 Hematuria, unspecified: Secondary | ICD-10-CM

## 2016-12-18 DIAGNOSIS — R351 Nocturia: Secondary | ICD-10-CM

## 2016-12-18 DIAGNOSIS — I10 Essential (primary) hypertension: Secondary | ICD-10-CM

## 2016-12-18 DIAGNOSIS — Z0000 Encounter for general adult medical examination without abnormal findings: Secondary | ICD-10-CM | POA: Diagnosis not present

## 2016-12-18 DIAGNOSIS — E7800 Pure hypercholesterolemia, unspecified: Secondary | ICD-10-CM | POA: Diagnosis not present

## 2016-12-18 LAB — COMPREHENSIVE METABOLIC PANEL
ALK PHOS: 36 U/L — AB (ref 39–117)
ALT: 23 U/L (ref 0–53)
AST: 32 U/L (ref 0–37)
Albumin: 4.3 g/dL (ref 3.5–5.2)
BUN: 17 mg/dL (ref 6–23)
CALCIUM: 9.3 mg/dL (ref 8.4–10.5)
CO2: 28 meq/L (ref 19–32)
CREATININE: 0.98 mg/dL (ref 0.40–1.50)
Chloride: 96 mEq/L (ref 96–112)
GFR: 80.28 mL/min (ref >60.00)
GLUCOSE: 82 mg/dL (ref 70–99)
Potassium: 4.1 mEq/L (ref 3.5–5.1)
Sodium: 131 mEq/L — ABNORMAL LOW (ref 135–145)
Total Bilirubin: 0.9 mg/dL (ref 0.2–1.2)
Total Protein: 6.6 g/dL (ref 6.0–8.3)

## 2016-12-18 LAB — CBC
HCT: 38.5 % — ABNORMAL LOW (ref 39.0–52.0)
HEMOGLOBIN: 13.4 g/dL (ref 13.0–17.0)
MCHC: 34.9 g/dL (ref 30.0–36.0)
MCV: 94 fl (ref 78.0–100.0)
PLATELETS: 182 10*3/uL (ref 150.0–400.0)
RBC: 4.09 Mil/uL — ABNORMAL LOW (ref 4.22–5.81)
RDW: 13.5 % (ref 11.5–15.5)
WBC: 4.2 10*3/uL (ref 4.0–10.5)

## 2016-12-18 LAB — LIPID PANEL
CHOL/HDL RATIO: 2
Cholesterol: 209 mg/dL — ABNORMAL HIGH (ref 0–200)
HDL: 91.9 mg/dL (ref >39.00)
LDL Cholesterol: 106 mg/dL — ABNORMAL HIGH (ref 0–99)
NONHDL: 116.72
Triglycerides: 53 mg/dL (ref 0.0–149.0)
VLDL: 10.6 mg/dL (ref 0.0–40.0)

## 2016-12-18 LAB — URINALYSIS, MICROSCOPIC ONLY

## 2016-12-18 LAB — PSA: PSA: 1.43 ng/mL (ref 0.10–4.00)

## 2016-12-18 NOTE — Assessment & Plan Note (Addendum)
S: last UA showed hematuria on dipstick but not on microscopic. Had urology evaluation but declined cystoscopy and CT as urology thought low risk of cancer <2% A/P: will update urine microscopic today- continue to monitor only as long as no blood

## 2016-12-18 NOTE — Progress Notes (Signed)
Subjective:  Thomas Craig is a 71 y.o. year old very pleasant male patient who presents for/with See problem oriented charting ROS- No chest pain or shortness of breath. No headache or blurry vision. Does have nocturia.    Past Medical History-  Patient Active Problem List   Diagnosis Date Noted  . History of melanoma 09/05/2014    Priority: High  . Hematuria 06/19/2016    Priority: Medium  . BPH (benign prostatic hyperplasia) 09/05/2014    Priority: Medium  . Essential hypertension 09/05/2014    Priority: Medium  . Hyperlipidemia 09/05/2014    Priority: Medium  . Bunion of great toe of left foot 10/20/2011    Priority: Low  . Acute right ankle pain 09/29/2011    Priority: Low    Medications- reviewed and updated Current Outpatient Prescriptions  Medication Sig Dispense Refill  . amLODipine (NORVASC) 2.5 MG tablet Take 1 tablet (2.5 mg total) by mouth daily. 91 tablet 3  . Ascorbic Acid (VITAMIN C) 1000 MG tablet Take 1,000 mg by mouth daily. 1-3 qd     . aspirin 325 MG tablet Take 325 mg by mouth daily.      Marland Kitchen b complex vitamins capsule Take 1 capsule by mouth daily.    . Calcium Carbonate-Vitamin D (CALCIUM-VITAMIN D) 500-200 MG-UNIT per tablet Take 1 tablet by mouth daily.      . cyanocobalamin 500 MCG tablet Take 500 mcg by mouth daily.    . Glucosamine-Chondroit-Vit C-Mn (GLUCOSAMINE CHONDR 1500 COMPLX) CAPS Take 1 capsule by mouth daily.    . Multiple Vitamin (MULTIVITAMIN) tablet Take 1 tablet by mouth daily.      . multivitamin-lutein (OCUVITE-LUTEIN) CAPS Take 1 capsule by mouth daily.    . OMEGA-3 1000 MG CAPS Take 4 capsules by mouth daily.      . Saw Palmetto, Serenoa repens, 1000 MG CAPS Take by mouth 1 day or 1 dose.      Objective: BP (!) 160/82 (BP Location: Left Arm, Patient Position: Sitting, Cuff Size: Large)   Pulse 76   Temp 97.7 F (36.5 C) (Oral)   Ht 5\' 7"  (1.702 m)   Wt 151 lb (68.5 kg)   SpO2 98%   BMI 23.65 kg/m  Gen: NAD, resting  comfortably CV: RRR no murmurs rubs or gallops Lungs: CTAB no crackles, wheeze, rhonchi Abdomen: soft/nontender/nondistended/normal bowel sounds. No rebound or guarding.  Ext: no edema Skin: warm, dry Neuro: grossly normal, moves all extremities   Assessment/Plan:  Hematuria S: last UA showed hematuria on dipstick but not on microscopic. Had urology evaluation but declined cystoscopy and CT as urology thought low risk of cancer <2% A/P: will update urine microscopic today- continue to monitor only as long as no blood  BPH (benign prostatic hyperplasia) S: Patient with BPH with nocturia. Saw palmetto fortunately has reduced nocturia from 3-5x a night to 1-2x a night Lab Results  Component Value Date   PSA 1.11 09/03/2015   PSA 1.11 10/11/2012   PSA 1.18 10/27/2011  A/P: update PSA- if stable trend may discontinue checks next year potentially but will discuss annually. Continue saw palmetto  Hyperlipidemia S: mild poorly controlled on no rx.  08/2015 ascvd risk 12% Lab Results  Component Value Date   CHOL 218 (H) 09/03/2015   HDL 92.40 09/03/2015   LDLCALC 117 (H) 09/03/2015   LDLDIRECT 100.5 10/27/2011   TRIG 43.0 09/03/2015   CHOLHDL 2 09/03/2015   A/P: update lipids today. Advised regular exercise and mediterranean  type diet. He is pretty reasonable on these fronts.     Essential hypertension S: controlled poorly in office. At home remains with excellent control on amlodipien 2.5 mg in range 129/79 over last 30 averaged. Home cuff validated today  Has already been working on salt intake.  BP Readings from Last 3 Encounters:  12/18/16 (!) 160/90 our cuff. Home cuff 159/93. On my repeat  11/07/16 (!) 142/70  06/19/16 (!) 148/84  A/P:Continue current medications. White coat is the cause of elevations in office.  6-12 months. Should monitor BP at home and if remains controlled may wait until 1 year. I did not perform rectal exam as intended- will need to do that on follow  up.   Orders Placed This Encounter  Procedures  . Urine Microscopic  . PSA  . Lipid panel    [place]    Order Specific Question:   Has the patient fasted?    Answer:   No  . CBC    [place]  . Comprehensive metabolic panel    [place]    Order Specific Question:   Has the patient fasted?    Answer:   No   Return precautions advised.  Garret Reddish, MD

## 2016-12-18 NOTE — Assessment & Plan Note (Signed)
S: Patient with BPH with nocturia. Saw palmetto fortunately has reduced nocturia from 3-5x a night to 1-2x a night Lab Results  Component Value Date   PSA 1.11 09/03/2015   PSA 1.11 10/11/2012   PSA 1.18 10/27/2011  A/P: update PSA- if stable trend may discontinue checks next year potentially but will discuss annually. Continue saw palmetto

## 2016-12-18 NOTE — Assessment & Plan Note (Signed)
S: controlled poorly in office. At home remains with excellent control on amlodipien 2.5 mg in range 129/79 over last 30 averaged. Home cuff validated today  Has already been working on salt intake.  BP Readings from Last 3 Encounters:  12/18/16 (!) 160/90 our cuff. Home cuff 159/93. On my repeat  11/07/16 (!) 142/70  06/19/16 (!) 148/84  A/P:Continue current medications. White coat is the cause of elevations in office.

## 2016-12-18 NOTE — Assessment & Plan Note (Signed)
S: mild poorly controlled on no rx.  08/2015 ascvd risk 12% Lab Results  Component Value Date   CHOL 218 (H) 09/03/2015   HDL 92.40 09/03/2015   LDLCALC 117 (H) 09/03/2015   LDLDIRECT 100.5 10/27/2011   TRIG 43.0 09/03/2015   CHOLHDL 2 09/03/2015   A/P: update lipids today. Advised regular exercise and mediterranean type diet. He is pretty reasonable on these fronts.

## 2016-12-18 NOTE — Progress Notes (Signed)
Subjective:   Thomas Craig is a 71 y.o. male who presents for Medicare Annual/Subsequent preventive examination. HRA assessment completed during this visit with Thomas Craig  The Patient was informed that the wellness visit is to identify future health risk and educate and initiate measures that can reduce risk for increased disease through the lifespan.    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 Very good   HM due  Colonoscopy 10/2007; repeat 10/2017 Educated regarding shingrix;  Zoster in 2014;    Update:  Tobacco: quit 1977; 71 yo  Smoked 5 years; 3.75 pack years  Discussed AAA but Will pass at this time in lieu of his very limited smoking;  2nd Hand Smoke no Chew or electronic cigarettes no ETOH: YES 1 to 2 per day  Medications no issues   BMI: 23.7 Diet; Cereals high fiber; grams of sugar are 8 or 9gam  Try to eat healthy; avoid red meat Hamburger at [employer] but not often Primarily fish, chicken seafood    Exercise;  Marathons; biking, gardening; occasional golf  50 /50  running and biking; awaiting heel of foot to heal.  Wife has mensicus so they enjoy biking together; Does power biking when he is alone Both taking ball room dancing as well  Trainer x 2 per week   Safety features reviewed for safe community;  firearms if in the home; no issues smoke alarms; had melanoma excision; father had likewise; sun protection when outside and dermatology checks skin  driving difficulties or accidents no issues   Advanced Directive yes  Hearing Screening Comments: Hearing issues none noted  Vision Screening Comments: Vision checked recently in the last year Dr.  Delman Craig    HOME SAFETY;  Fall hx; fallen while running at times; trips; only scrapes with no serious injury; plans to start yoga and pilates soon  Gait: normal Given education on "Fall Prevention in the Home" for more safety tips the patient can apply as appropriate.  Long term goal is to "age in place"    Mental Health:  Any emotional problems? Anxious, depressed, irritable, sad or blue? no Denies feeling depressed or hopeless; voices pleasure in daily life How many social activities have you been engaged in within the last 2 weeks? no   Pain: denies pain with extreme exercise   Cognitive; (mother had alz and father)  Dad was 68; mother died in late 7's; only noted memory issues a couple of years prior to death Manages checkbook, medications; no failures of task Ad8 score reviewed for issues;  Issues making decisions; no  Less interest in hobbies / activities" no  Repeats questions, stories; family complaining: NO  Trouble using ordinary gadgets; microwave; computer: no  Forgets the month or year: no  Mismanaging finances: no  Missing apt: no but does write them down  Daily problems with thinking of memory NO Ad8 score is 0     Immunizations Due: (Vaccines reviewed and educated regarding any overdue)   Patient Care Team: Marin Olp, MD as PCP - General (Family Medicine) Cardiac Risk Factors include: advanced age (>80men, >16 women);hypertension;male gender  Objective:    Vitals: BP (!) 160/82 (BP Location: Left Arm, Patient Position: Sitting, Cuff Size: Large)   Pulse 76   Temp 97.7 F (36.5 C) (Oral)   Ht 5\' 7"  (1.702 m)   Wt 151 lb (68.5 kg)   SpO2 98%   BMI 23.65 kg/m   Body mass index is 23.65 kg/m.  States that he brought his home reading as he checks his BP by fit [employer]; was accurate when checked in the office today. Is running 120 to 130 / 80 at  Home  Tobacco History  Smoking Status  . Former Smoker  . Packs/day: 0.75  . Years: 5.00  .  Types: Cigarettes  . Quit date: 08/05/1975  Smokeless Tobacco  . Never Used    Comment: ONLY SMOKED FOR 5 Y EARS or less with limited cigarettes     Counseling given: Yes   Past Medical History:  Diagnosis Date  . BPH (benign prostatic hyperplasia) 09/05/2014   Saw palmetto has improved nocturia 3-5x a night to 1-2x a night.    . Bunion of great toe of left foot 10/20/2011   Hallux valgus shift on left with some early changes starting on RT   . Melanoma of skin ([place]) 09/05/2014   Dr. Allyson Sabal. Every 6 month follow up.     Past Surgical History:  Procedure Laterality Date  . MELANOMA EXCISION     Family History  Problem Relation Age of Onset  . Alzheimer's disease Mother        lived to 13  . Melanoma Father   . Alzheimer's disease Father        lived to 58  . Anxiety disorder Sister        agoraphobia   History  Sexual Activity  . Sexual activity: Yes    Outpatient Encounter Prescriptions as of 12/18/2016  Medication Sig  . amLODipine (NORVASC) 2.5 MG tablet Take 1 tablet (2.5 mg total) by mouth daily.  . Ascorbic Acid (VITAMIN C) 1000 MG tablet Take 1,000 mg by mouth daily. 1-3 qd   . aspirin 325 MG tablet Take 325 mg by mouth daily.    Marland Kitchen b complex vitamins capsule Take 1 capsule by mouth daily.  . Calcium Carbonate-Vitamin D (CALCIUM-VITAMIN D) 500-200 MG-UNIT per tablet Take 1 tablet by mouth daily.    . cyanocobalamin 500 MCG tablet Take 500 mcg by mouth daily.  . Glucosamine-Chondroit-Vit C-Mn (GLUCOSAMINE CHONDR 1500 COMPLX) CAPS Take 1 capsule by mouth daily.  . Multiple Vitamin (MULTIVITAMIN) tablet Take 1 tablet by mouth daily.    . multivitamin-lutein (OCUVITE-LUTEIN) CAPS Take 1 capsule by mouth daily.  . OMEGA-3 1000 MG CAPS Take 4 capsules by mouth daily.    . Saw Palmetto, Serenoa repens, 1000 MG CAPS Take by mouth 1 day or 1 dose.    No facility-administered encounter medications on file as of 12/18/2016.     Activities of Daily Living In your present state  of health, do you have any difficulty performing the following activities: 12/18/2016  Hearing? N  Vision? N  Difficulty concentrating or making decisions? N  Walking or climbing stairs? N  Dressing or bathing? N  Doing errands, shopping? N  Preparing Food and eating ? N  Using the Toilet? N  In the past six months, have you accidently leaked urine? N  Do you have problems with loss of bowel control? N  Managing your Medications? N  Managing your Finances? N  Housekeeping or managing your  Housekeeping? N  Some recent data might be hidden    Patient Care Team: Marin Olp, MD as PCP - General (Family Medicine)   Assessment:     Exercise Activities and Dietary recommendations Current Exercise Habits: Home exercise routine;Structured exercise class, Type of exercise: strength training/weights, Time (Minutes): > 60 (one hour of running; or one hour of biking ), Intensity: Intense  Goals    . Blood Pressure < 120/70          Will monitor sodium 2500 mg or 1 tsp of sodium daily  Or you can drink lemon water (hot) in the am x 3 if you have had a lot of sodium         Fall Risk Fall Risk  12/18/2016 06/19/2016 09/05/2014 04/20/2013  Falls in the past year? Yes No No No  Number falls in past yr: 1 - - -  Follow up Education provided - - -   Depression Screen PHQ 2/9 Scores 12/18/2016 06/19/2016 09/05/2014 04/20/2013  PHQ - 2 Score 0 0 0 0    Cognitive Function MMSE - Mini Mental State Exam 12/18/2016  Not completed: (No Data)     no issues; bright and alert     Immunization History  Administered Date(s) Administered  . Influenza Whole 07/04/2009  . Influenza,inj,Quad PF,36+ Mos 04/20/2013, 03/19/2016  . Influenza-Unspecified 05/16/2014, 05/16/2015  . Pneumococcal Conjugate-13 09/05/2014  . Pneumococcal Polysaccharide-23 10/18/2012  . Td 08/04/2005  . Tdap 09/20/2015  . Zoster 10/18/2012   Screening Tests Health Maintenance  Topic Date Due  . INFLUENZA VACCINE   03/04/2017  . COLONOSCOPY  10/27/2017  . TETANUS/TDAP  09/19/2025  . Hepatitis C Screening  Completed  . PNA vac Low Risk Adult  Completed      Plan:      PCP Notes  Health Maintenance Educated regarding shingrix Declined AAA due to limited smoking hx   Abnormal Screens  none  Referrals none  Patient concerns; discussed monitoring sodium Given levels and given information on the DASH eating plan   Nurse Concerns; none; BP checked at home routinely and is good  Next PCP apt   I have personally reviewed and noted the following in the patient's chart:   . Medical and social history . Use of alcohol, tobacco or illicit drugs  . Current medications and supplements . Functional ability and status . Nutritional status . Physical activity . Advanced directives . List of other physicians . Hospitalizations, surgeries, and ER visits in previous 12 months . Vitals . Screenings to include cognitive, depression, and falls . Referrals and appointments  In addition, I have reviewed and discussed with patient certain preventive protocols, quality metrics, and best practice recommendations. A written personalized care plan for preventive services as well as general preventive health recommendations were provided to patient.     PJKDT,OIZTI, RN  12/18/2016   I have reviewed and agree with note, evaluation, plan.   Garret Reddish, MD

## 2016-12-18 NOTE — Patient Instructions (Addendum)
Please stop by lab before you go  Thanks for visiting [facility] today  ______________________________________________________________________  Starting October 1st 2018, I will be transferring to our new location: [facility] (corner of [address] [employer]) [place], [place] Phone: [phone]  I would love to have you remain my patient at this new location as long as it remains convenient for you. I am excited about the opportunity to have x-ray and sports medicine in the new building but will really miss the awesome staff and physicians at [place]. Continue to schedule appointments at [facility] and we will automatically transfer them to the [place] location starting October 1st.     Thomas Craig , Thank you for taking time to come for your Medicare Wellness Visit. I appreciate your ongoing commitment to your health goals. Please review the following plan we discussed and let me know if I can assist you in the future.   Shingrix is a vaccine for the prevention of Shingles in Adults 50 and older.  If you are on Medicare, you can request a prescription from your doctor to be filled at a pharmacy.  Please check with your benefits regarding applicable copays or out of pocket expenses.  The Shingrix is given in 2 vaccines approx 8 weeks apart. You must receive the 2nd dose prior to 6 months from receipt of the first.     These are the goals we discussed: Goals    . Blood Pressure < 120/70          Will monitor sodium 2500 mg or 1 tsp of sodium daily  Or you can drink lemon water (hot) in the am x 3 if you have had a lot of sodium          This is a list of the screening recommended for you and due dates:  Health Maintenance  Topic Date Due  . Flu Shot  03/04/2017  . Colon Cancer Screening  10/27/2017  . Tetanus Vaccine  09/19/2025  .  Hepatitis C: One time screening is  recommended by Center for Disease Control  (CDC) for  adults born from [birth year] through 1965.   Completed  . Pneumonia vaccines  Completed    DASH Eating Plan DASH stands for "Dietary Approaches to Stop Hypertension." The DASH eating plan is a healthy eating plan that has been shown to reduce high blood pressure (hypertension). It may also reduce your risk for type 2 diabetes, heart disease, and stroke. The DASH eating plan may also help with weight loss. What are tips for following this plan? General guidelines   Avoid eating more than 2,300 mg (milligrams) of salt (sodium) a day. If you have hypertension, you may need to reduce your sodium intake to 1,500 mg a day.  Limit alcohol intake to no more than 1 drink a day for nonpregnant women and 2 drinks a day for men. One drink equals 12 oz of beer, 5 oz of wine, or 1 oz of hard liquor.  Work with your health care provider to maintain a healthy body weight or to lose weight. Ask what an ideal weight is for you.  Get at least 30 minutes of exercise that causes your heart to beat faster (aerobic exercise) most days of the week. Activities may include walking, swimming, or biking.  Work with your health care provider or diet and nutrition specialist (dietitian) to adjust your eating  plan to your individual calorie needs. Reading food labels   Check food labels for the amount of sodium per serving. Choose foods with less than 5 percent of the Daily Value of sodium. Generally, foods with less than 300 mg of sodium per serving fit into this eating plan.  To find whole grains, look for the word "whole" as the first word in the ingredient list. Shopping   Buy products labeled as "low-sodium" or "no salt added."  Buy fresh foods. Avoid canned foods and premade or frozen meals. Cooking   Avoid adding salt when cooking. Use salt-free seasonings or herbs instead of table salt or sea salt. Check with your health care provider or pharmacist before using  salt substitutes.  Do not fry foods. Cook foods using healthy methods such as baking, boiling, grilling, and broiling instead.  Cook with heart-healthy oils, such as olive, canola, soybean, or sunflower oil. Meal planning    Eat a balanced diet that includes:  5 or more servings of fruits and vegetables each day. At each meal, try to fill half of your plate with fruits and vegetables.  Up to 6-8 servings of whole grains each day.  Less than 6 oz of lean meat, poultry, or fish each day. A 3-oz serving of meat is about the same size as a deck of cards. One egg equals 1 oz.  2 servings of low-fat dairy each day.  A serving of nuts, seeds, or beans 5 times each week.  Heart-healthy fats. Healthy fats called Omega-3 fatty acids are found in foods such as flaxseeds and coldwater fish, like sardines, salmon, and mackerel.  Limit how much you eat of the following:  Canned or prepackaged foods.  Food that is high in trans fat, such as fried foods.  Food that is high in saturated fat, such as fatty meat.  Sweets, desserts, sugary drinks, and other foods with added sugar.  Full-fat dairy products.  Do not salt foods before eating.  Try to eat at least 2 vegetarian meals each week.  Eat more home-cooked food and less restaurant, buffet, and fast food.  When eating at a restaurant, ask that your food be prepared with less salt or no salt, if possible. What foods are recommended? The items listed may not be a complete list. Talk with your dietitian about what dietary choices are best for you. Grains  Whole-grain or whole-wheat bread. Whole-grain or whole-wheat pasta. Brown rice. [place]. Bulgur. Whole-grain and low-sodium cereals. Pita bread. Low-fat, low-sodium crackers. Whole-wheat flour tortillas. Vegetables  Fresh or frozen vegetables (raw, steamed, roasted, or grilled). Low-sodium or reduced-sodium tomato and vegetable juice. Low-sodium or reduced-sodium tomato sauce and  tomato paste. Low-sodium or reduced-sodium canned vegetables. Fruits  All fresh, dried, or frozen fruit. Canned fruit in natural juice (without added sugar). Meat and other protein foods  Skinless chicken or [place]. Ground chicken or [place]. Pork with fat trimmed off. Fish and seafood. Egg whites. Dried beans, peas, or lentils. Unsalted nuts, nut butters, and seeds. Unsalted canned beans. Lean cuts of beef with fat trimmed off. Low-sodium, lean deli meat. Dairy  Low-fat (1%) or fat-free (skim) milk. Fat-free, low-fat, or reduced-fat cheeses. Nonfat, low-sodium ricotta or cottage cheese. Low-fat or nonfat yogurt. Low-fat, low-sodium cheese. Fats and oils  Soft margarine without trans fats. Vegetable oil. Low-fat, reduced-fat, or light mayonnaise and salad dressings (reduced-sodium). Canola, safflower, olive, soybean, and sunflower oils. Avocado. Seasoning and other foods  Herbs. Spices. Seasoning mixes without salt. Unsalted popcorn and  pretzels. Fat-free sweets. What foods are not recommended? The items listed may not be a complete list. Talk with your dietitian about what dietary choices are best for you. Grains  Baked goods made with fat, such as croissants, muffins, or some breads. Dry pasta or rice meal packs. Vegetables  Creamed or fried vegetables. Vegetables in a cheese sauce. Regular canned vegetables (not low-sodium or reduced-sodium). Regular canned tomato sauce and paste (not low-sodium or reduced-sodium). Regular tomato and vegetable juice (not low-sodium or reduced-sodium). Angie Fava. Olives. Fruits  Canned fruit in a light or heavy syrup. Fried fruit. Fruit in cream or butter sauce. Meat and other protein foods  Fatty cuts of meat. Ribs. Fried meat. Berniece Salines. Sausage. Bologna and other processed lunch meats. Salami. Fatback. Hotdogs. Bratwurst. Salted nuts and seeds. Canned beans with added salt. Canned or smoked fish. Whole eggs or egg yolks. Chicken or [place] with skin. Dairy  Whole  or 2% milk, cream, and half-and-half. Whole or full-fat cream cheese. Whole-fat or sweetened yogurt. Full-fat cheese. Nondairy creamers. Whipped toppings. Processed cheese and cheese spreads. Fats and oils  Butter. Stick margarine. Lard. Shortening. Ghee. Bacon fat. Tropical oils, such as coconut, palm kernel, or palm oil. Seasoning and other foods  Salted popcorn and pretzels. Onion salt, garlic salt, seasoned salt, table salt, and sea salt. Worcestershire sauce. Tartar sauce. Barbecue sauce. Teriyaki sauce. Soy sauce, including reduced-sodium. Steak sauce. Canned and packaged gravies. Fish sauce. Oyster sauce. Cocktail sauce. Horseradish that you find on the shelf. Ketchup. Mustard. Meat flavorings and tenderizers. Bouillon cubes. Hot sauce and Tabasco sauce. Premade or packaged marinades. Premade or packaged taco seasonings. Relishes. Regular salad dressings. Where to find more information:  National Heart, Lung, and [place]: https://wilson-eaton.com/  American Heart Association: www.heart.org Summary  The DASH eating plan is a healthy eating plan that has been shown to reduce high blood pressure (hypertension). It may also reduce your risk for type 2 diabetes, heart disease, and stroke.  With the DASH eating plan, you should limit salt (sodium) intake to 2,300 mg a day. If you have hypertension, you may need to reduce your sodium intake to 1,500 mg a day.  When on the DASH eating plan, aim to eat more fresh fruits and vegetables, whole grains, lean proteins, low-fat dairy, and heart-healthy fats.  Work with your health care provider or diet and nutrition specialist (dietitian) to adjust your eating plan to your individual calorie needs. This information is not intended to replace advice given to you by your health care provider. Make sure you discuss any questions you have with your health care provider. Document Released: 07/10/2011 Document Revised: 07/14/2016 Document Reviewed:  07/14/2016 Elsevier Interactive Patient Education  2017 [place] Maintenance, Male A healthy lifestyle and preventive care is important for your health and wellness. Ask your health care provider about what schedule of regular examinations is right for you. What should I know about weight and diet?  Eat a Healthy Diet  Eat plenty of vegetables, fruits, whole grains, low-fat dairy products, and lean protein.  Do not eat a lot of foods high in solid fats, added sugars, or salt. Maintain a Healthy Weight  Regular exercise can help you achieve or maintain a healthy weight. You should:  Do at least 150 minutes of exercise each week. The exercise should increase your heart rate and make you sweat (moderate-intensity exercise).  Do strength-training exercises at least twice a week. Watch Your Levels of Cholesterol and Blood Lipids  Have your  blood tested for lipids and cholesterol every 5 years starting at 71 years of age. If you are at high risk for heart disease, you should start having your blood tested when you are 71 years old. You may need to have your cholesterol levels checked more often if:  Your lipid or cholesterol levels are high.  You are older than 71 years of age.  You are at high risk for heart disease. What should I know about cancer screening? Many types of cancers can be detected early and may often be prevented. Lung Cancer  You should be screened every year for lung cancer if:  You are a current smoker who has smoked for at least 30 years.  You are a former smoker who has quit within the past 15 years.  Talk to your health care provider about your screening options, when you should start screening, and how often you should be screened. Colorectal Cancer  Routine colorectal cancer screening usually begins at 71 years of age and should be repeated every 5-10 years until you are 71 years old. You may need to be screened more often if early forms of  precancerous polyps or small growths are found. Your health care provider may recommend screening at an earlier age if you have risk factors for colon cancer.  Your health care provider may recommend using home test kits to check for hidden blood in the stool.  A small camera at the end of a tube can be used to examine your colon (sigmoidoscopy or colonoscopy). This checks for the earliest forms of colorectal cancer. Prostate and Testicular Cancer  Depending on your age and overall health, your health care provider may do certain tests to screen for prostate and testicular cancer.  Talk to your health care provider about any symptoms or concerns you have about testicular or prostate cancer. Skin Cancer  Check your skin from head to toe regularly.  Tell your health care provider about any new moles or changes in moles, especially if:  There is a change in a mole's size, shape, or color.  You have a mole that is larger than a pencil eraser.  Always use sunscreen. Apply sunscreen liberally and repeat throughout the day.  Protect yourself by wearing long sleeves, pants, a wide-brimmed hat, and sunglasses when outside. What should I know about heart disease, diabetes, and high blood pressure?  If you are 95-28 years of age, have your blood pressure checked every 3-5 years. If you are 85 years of age or older, have your blood pressure checked every year. You should have your blood pressure measured twice-once when you are at a hospital or clinic, and once when you are not at a hospital or clinic. Record the average of the two measurements. To check your blood pressure when you are not at a hospital or clinic, you can use:  An automated blood pressure machine at a pharmacy.  A home blood pressure monitor.  Talk to your health care provider about your target blood pressure.  If you are between 5-31 years old, ask your health care provider if you should take aspirin to prevent heart  disease.  Have regular diabetes screenings by checking your fasting blood sugar level.  If you are at a normal weight and have a low risk for diabetes, have this test once every three years after the age of 39.  If you are overweight and have a high risk for diabetes, consider being tested at a younger age  or more often.  A one-time screening for abdominal aortic aneurysm (AAA) by ultrasound is recommended for men aged 9-75 years who are current or former smokers. What should I know about preventing infection? Hepatitis B  If you have a higher risk for hepatitis B, you should be screened for this virus. Talk with your health care provider to find out if you are at risk for hepatitis B infection. Hepatitis C  Blood testing is recommended for:  Everyone born from [birth year] through 1965.  Anyone with known risk factors for hepatitis C. Sexually Transmitted Diseases (STDs)  You should be screened each year for STDs including gonorrhea and chlamydia if:  You are sexually active and are younger than 70 years of age.  You are older than 71 years of age and your health care provider tells you that you are at risk for this type of infection.  Your sexual activity has changed since you were last screened and you are at an increased risk for chlamydia or gonorrhea. Ask your health care provider if you are at risk.  Talk with your health care provider about whether you are at high risk of being infected with HIV. Your health care provider may recommend a prescription medicine to help prevent HIV infection. What else can I do?  Schedule regular health, dental, and eye exams.  Stay current with your vaccines (immunizations).  Do not use any tobacco products, such as cigarettes, chewing tobacco, and e-cigarettes. If you need help quitting, ask your health care provider.  Limit alcohol intake to no more than 2 drinks per day. One drink equals 12 ounces of beer, 5 ounces of wine, or 1 ounces of hard  liquor.  Do not use street drugs.  Do not share needles.  Ask your health care provider for help if you need support or information about quitting drugs.  Tell your health care provider if you often feel depressed.  Tell your health care provider if you have ever been abused or do not feel safe at home. This information is not intended to replace advice given to you by your health care provider. Make sure you discuss any questions you have with your health care provider. Document Released: 01/17/2008 Document Revised: 03/19/2016 Document Reviewed: 04/24/2015 Elsevier Interactive Patient Education  2017 [place] Prevention in the Home Falls can cause injuries and can affect people from all age groups. There are many simple things that you can do to make your home safe and to help prevent falls. What can I do on the outside of my home?  Regularly repair the edges of walkways and driveways and fix any cracks.  Remove high doorway thresholds.  Trim any shrubbery on the main path into your home.  Use bright outdoor lighting.  Clear walkways of debris and clutter, including tools and rocks.  Regularly check that handrails are securely fastened and in good repair. Both sides of any steps should have handrails.  Install guardrails along the edges of any raised decks or porches.  Have leaves, snow, and ice cleared regularly.  Use sand or salt on walkways during winter months.  In the garage, clean up any spills right away, including grease or oil spills. What can I do in the bathroom?  Use night lights.  Install grab bars by the toilet and in the tub and shower. Do not use towel bars as grab bars.  Use non-skid mats or decals on the floor of the tub or shower.  If you need to sit down while you are in the shower, use a plastic, non-slip stool.  Keep the floor dry. Immediately clean up any water that spills on the floor.  Remove soap buildup in the tub or shower  on a regular basis.  Attach bath mats securely with double-sided non-slip rug tape.  Remove throw rugs and other tripping hazards from the floor. What can I do in the bedroom?  Use night lights.  Make sure that a bedside light is easy to reach.  Do not use oversized bedding that drapes onto the floor.  Have a firm chair that has side arms to use for getting dressed.  Remove throw rugs and other tripping hazards from the floor. What can I do in the kitchen?  Clean up any spills right away.  Avoid walking on wet floors.  Place frequently used items in easy-to-reach places.  If you need to reach for something above you, use a sturdy step stool that has a grab bar.  Keep electrical cables out of the way.  Do not use floor polish or wax that makes floors slippery. If you have to use wax, make sure that it is non-skid floor wax.  Remove throw rugs and other tripping hazards from the floor. What can I do in the stairways?  Do not leave any items on the stairs.  Make sure that there are handrails on both sides of the stairs. Fix handrails that are broken or loose. Make sure that handrails are as long as the stairways.  Check any carpeting to make sure that it is firmly attached to the stairs. Fix any carpet that is loose or worn.  Avoid having throw rugs at the top or bottom of stairways, or secure the rugs with carpet tape to prevent them from moving.  Make sure that you have a light switch at the top of the stairs and the bottom of the stairs. If you do not have them, have them installed. What are some other fall prevention tips?  Wear closed-toe shoes that fit well and support your feet. Wear shoes that have rubber soles or low heels.  When you use a stepladder, make sure that it is completely opened and that the sides are firmly locked. Have someone hold the ladder while you are using it. Do not climb a closed stepladder.  Add color or contrast paint or tape to grab bars  and handrails in your home. Place contrasting color strips on the first and last steps.  Use mobility aids as needed, such as canes, walkers, scooters, and crutches.  Turn on lights if it is dark. Replace any light bulbs that burn out.  Set up furniture so that there are clear paths. Keep the furniture in the same spot.  Fix any uneven floor surfaces.  Choose a carpet design that does not hide the edge of steps of a stairway.  Be aware of any and all pets.  Review your medicines with your healthcare provider. Some medicines can cause dizziness or changes in blood pressure, which increase your risk of falling. Talk with your health care provider about other ways that you can decrease your risk of falls. This may include working with a physical therapist or trainer to improve your strength, balance, and endurance. This information is not intended to replace advice given to you by your health care provider. Make sure you discuss any questions you have with your health care provider. Document Released: 07/11/2002 Document Revised: 12/18/2015 Document  Reviewed: 08/25/2014 Elsevier Interactive Patient Education  2017 [employer].

## 2016-12-22 ENCOUNTER — Telehealth: Payer: Self-pay | Admitting: Family Medicine

## 2016-12-22 NOTE — Telephone Encounter (Signed)
[facility] Craig pt returned your call. °

## 2016-12-23 ENCOUNTER — Other Ambulatory Visit: Payer: Self-pay

## 2016-12-23 DIAGNOSIS — E871 Hypo-osmolality and hyponatremia: Secondary | ICD-10-CM

## 2016-12-23 NOTE — Telephone Encounter (Signed)
Spoke with patient regarding lab results. 

## 2017-01-20 ENCOUNTER — Other Ambulatory Visit (INDEPENDENT_AMBULATORY_CARE_PROVIDER_SITE_OTHER): Payer: Medicare Other

## 2017-01-20 DIAGNOSIS — E871 Hypo-osmolality and hyponatremia: Secondary | ICD-10-CM

## 2017-01-20 LAB — BASIC METABOLIC PANEL
BUN: 17 mg/dL (ref 6–23)
CALCIUM: 9.4 mg/dL (ref 8.4–10.5)
CHLORIDE: 96 meq/L (ref 96–112)
CO2: 29 meq/L (ref 19–32)
CREATININE: 0.91 mg/dL (ref 0.40–1.50)
GFR: 87.43 mL/min (ref >60.00)
GLUCOSE: 85 mg/dL (ref 70–99)
Potassium: 3.7 mEq/L (ref 3.5–5.1)
Sodium: 132 mEq/L — ABNORMAL LOW (ref 135–145)

## 2017-04-30 DIAGNOSIS — L814 Other melanin hyperpigmentation: Secondary | ICD-10-CM | POA: Diagnosis not present

## 2017-04-30 DIAGNOSIS — L821 Other seborrheic keratosis: Secondary | ICD-10-CM | POA: Diagnosis not present

## 2017-04-30 DIAGNOSIS — Z85820 Personal history of malignant melanoma of skin: Secondary | ICD-10-CM | POA: Diagnosis not present

## 2017-04-30 DIAGNOSIS — D225 Melanocytic nevi of trunk: Secondary | ICD-10-CM | POA: Diagnosis not present

## 2017-04-30 DIAGNOSIS — L570 Actinic keratosis: Secondary | ICD-10-CM | POA: Diagnosis not present

## 2017-05-04 DIAGNOSIS — H2513 Age-related nuclear cataract, bilateral: Secondary | ICD-10-CM | POA: Diagnosis not present

## 2017-05-25 ENCOUNTER — Ambulatory Visit (INDEPENDENT_AMBULATORY_CARE_PROVIDER_SITE_OTHER): Payer: Medicare Other | Admitting: Physician Assistant

## 2017-05-25 DIAGNOSIS — Z23 Encounter for immunization: Secondary | ICD-10-CM

## 2017-06-19 ENCOUNTER — Ambulatory Visit: Payer: Medicare Other | Admitting: Family Medicine

## 2017-06-23 ENCOUNTER — Ambulatory Visit (INDEPENDENT_AMBULATORY_CARE_PROVIDER_SITE_OTHER): Payer: Medicare Other | Admitting: Family Medicine

## 2017-06-23 ENCOUNTER — Encounter: Payer: Self-pay | Admitting: Family Medicine

## 2017-06-23 VITALS — BP 158/90 | HR 85 | Temp 98.1°F | Ht 67.0 in | Wt 155.4 lb

## 2017-06-23 DIAGNOSIS — E785 Hyperlipidemia, unspecified: Secondary | ICD-10-CM

## 2017-06-23 DIAGNOSIS — I10 Essential (primary) hypertension: Secondary | ICD-10-CM

## 2017-06-23 DIAGNOSIS — R351 Nocturia: Secondary | ICD-10-CM

## 2017-06-23 DIAGNOSIS — N401 Enlarged prostate with lower urinary tract symptoms: Secondary | ICD-10-CM | POA: Diagnosis not present

## 2017-06-23 MED ORDER — AMLODIPINE BESYLATE 5 MG PO TABS: 5.0000 mg | ORAL_TABLET | Freq: Every day | ORAL | 3 refills | Status: DC

## 2017-06-23 NOTE — Patient Instructions (Addendum)
Increase amlodipine to 5mg . Follow up in 6 months. Can also sign up for annual wellness visit with Cassie at same time. Come fasting

## 2017-06-23 NOTE — Assessment & Plan Note (Signed)
S: controlled poorly in office today. Controlled at home on amlodipine 2.5mg  with average 132/78. Home cuff validated last visit. Low salt intake.   BP Readings from Last 3 Encounters:  06/23/17 (!) 158/90  12/18/16 (!) 160/82  11/07/16 (!) 142/70  A/P: We discussed blood pressure goal of <140/90, under 130 even better. Continue current meds:  But increase amlodipine to 5mg 

## 2017-06-23 NOTE — Progress Notes (Signed)
Subjective:  Thomas Craig is a 71 y.o. year old very pleasant male patient who presents for/with See problem oriented charting ROS- No chest pain or shortness of breath. No headache or blurry vision. No edema. Some nocturia.    Past Medical History-  Patient Active Problem List   Diagnosis Date Noted  . History of melanoma 09/05/2014    Priority: High  . Hematuria 06/19/2016    Priority: Medium  . BPH (benign prostatic hyperplasia) 09/05/2014    Priority: Medium  . Essential hypertension 09/05/2014    Priority: Medium  . Hyperlipidemia 09/05/2014    Priority: Medium  . Bunion of great toe of left foot 10/20/2011    Priority: Low  . Acute right ankle pain 09/29/2011    Priority: Low    Medications- reviewed and updated Current Outpatient Medications  Medication Sig Dispense Refill  . Ascorbic Acid (VITAMIN C) 1000 MG tablet Take 1,000 mg by mouth daily. 1-3 qd     . aspirin 325 MG tablet Take 325 mg by mouth daily.      Marland Kitchen b complex vitamins capsule Take 1 capsule by mouth daily.    . Calcium Carbonate-Vitamin D (CALCIUM-VITAMIN D) 500-200 MG-UNIT per tablet Take 1 tablet by mouth daily.      . cyanocobalamin 500 MCG tablet Take 500 mcg by mouth daily.    . Glucosamine-Chondroit-Vit C-Mn (GLUCOSAMINE CHONDR 1500 COMPLX) CAPS Take 1 capsule by mouth daily.    . Multiple Vitamin (MULTIVITAMIN) tablet Take 1 tablet by mouth daily.      . multivitamin-lutein (OCUVITE-LUTEIN) CAPS Take 1 capsule by mouth daily.    . OMEGA-3 1000 MG CAPS Take 4 capsules by mouth daily.      . Saw Palmetto, Serenoa repens, 1000 MG CAPS Take by mouth 1 day or 1 dose.     Marland Kitchen amLODipine (NORVASC) 5 MG tablet Take 1 tablet (5 mg total) by mouth daily. 90 tablet 3   No current facility-administered medications for this visit.     Objective: BP (!) 158/90 (BP Location: Left Arm, Patient Position: Sitting, Cuff Size: Large)   Pulse 85   Temp 98.1 F (36.7 C) (Oral)   Ht 5\' 7"  (1.702 m)   Wt 155 lb  6.4 oz (70.5 kg)   SpO2 97%   BMI 24.34 kg/m  Gen: NAD, resting comfortably CV: RRR no murmurs rubs or gallops Lungs: CTAB no crackles, wheeze, rhonchi Ext: no edema Skin: warm, dry, no rash  Assessment/Plan: Other issues 1. No more blood in urine. Saw urology previously- declined advanced workup as was told relatively low risk. UA looked good for [place] here last visit 2. unisom some nights and finds this helpful. Has trouble with sleep maintenance. In bed 7-8 hours for most part but wakes up around 5-6 hours and able to go back to sleep- also gets up to pee then so could be related to BPH as well.  3. Had planned on rectal exam but states had at urology  Essential hypertension S: controlled poorly in office today. Controlled at home on amlodipine 2.5mg  with average 132/78. Home cuff validated last visit. Low salt intake.   BP Readings from Last 3 Encounters:  06/23/17 (!) 158/90  12/18/16 (!) 160/82  11/07/16 (!) 142/70  A/P: We discussed blood pressure goal of <140/90, under 130 even better. Continue current meds:  But increase amlodipine to 5mg   BPH (benign prostatic hyperplasia) S: patient still on saw palmetto for bph with nocturia. Doing ok with  1-2x episode a night- mainly one. Much down from 4-5 from years ago when was not on medicatoin Lab Results  Component Value Date   PSA 1.43 12/18/2016   PSA 1.11 09/03/2015   PSA 1.11 10/11/2012  A/P: continue current medication- update psa next visit likely given slight bump last visit. Ended up declining rectal until next visit   Hyperlipidemia S: mild poorly controlled on no rx  Lab Results  Component Value Date   CHOL 209 (H) 12/18/2016   HDL 91.90 12/18/2016   LDLCALC 106 (H) 12/18/2016   LDLDIRECT 100.5 10/27/2011   TRIG 53.0 12/18/2016   CHOLHDL 2 12/18/2016   A/P: patient concerned about statin side effects and wants to remain off given LDL close to goal of 100- he would like to see if he can improve diet and return for  fasting labs next visit    Future Appointments  Date Time Provider [place]  12/21/2017  1:15 PM Marin Olp, MD [facility] None   Return in about 6 months (around 12/21/2017).  Meds ordered this encounter  Medications  . amLODipine (NORVASC) 5 MG tablet    Sig: Take 1 tablet (5 mg total) by mouth daily.    Dispense:  90 tablet    Refill:  3   Return precautions advised.  Garret Reddish, MD

## 2017-06-24 NOTE — Assessment & Plan Note (Signed)
S: mild poorly controlled on no rx  Lab Results  Component Value Date   CHOL 209 (H) 12/18/2016   HDL 91.90 12/18/2016   LDLCALC 106 (H) 12/18/2016   LDLDIRECT 100.5 10/27/2011   TRIG 53.0 12/18/2016   CHOLHDL 2 12/18/2016   A/P: patient concerned about statin side effects and wants to remain off given LDL close to goal of 100- he would like to see if he can improve diet and return for fasting labs next visit

## 2017-06-24 NOTE — Assessment & Plan Note (Signed)
S: patient still on saw palmetto for bph with nocturia. Doing ok with 1-2x episode a night- mainly one. Much down from 4-5 from years ago when was not on medicatoin Lab Results  Component Value Date   PSA 1.43 12/18/2016   PSA 1.11 09/03/2015   PSA 1.11 10/11/2012  A/P: continue current medication- update psa next visit likely given slight bump last visit. Ended up declining rectal until next visit

## 2017-09-01 DIAGNOSIS — D225 Melanocytic nevi of trunk: Secondary | ICD-10-CM | POA: Diagnosis not present

## 2017-09-01 DIAGNOSIS — L821 Other seborrheic keratosis: Secondary | ICD-10-CM | POA: Diagnosis not present

## 2017-09-01 DIAGNOSIS — L814 Other melanin hyperpigmentation: Secondary | ICD-10-CM | POA: Diagnosis not present

## 2017-09-01 DIAGNOSIS — Z85820 Personal history of malignant melanoma of skin: Secondary | ICD-10-CM | POA: Diagnosis not present

## 2017-09-01 DIAGNOSIS — D1801 Hemangioma of skin and subcutaneous tissue: Secondary | ICD-10-CM | POA: Diagnosis not present

## 2017-11-11 ENCOUNTER — Encounter: Payer: Self-pay | Admitting: Physician Assistant

## 2017-11-17 ENCOUNTER — Encounter: Payer: Self-pay | Admitting: Gastroenterology

## 2017-11-22 ENCOUNTER — Other Ambulatory Visit: Payer: Self-pay

## 2017-11-22 ENCOUNTER — Emergency Department (HOSPITAL_COMMUNITY)
Admission: EM | Admit: 2017-11-22 | Discharge: 2017-11-22 | Disposition: A | Discharge status: Home / Self Care | Payer: Medicare Other | Attending: Emergency Medicine | Admitting: Emergency Medicine

## 2017-11-22 ENCOUNTER — Encounter (HOSPITAL_COMMUNITY): Payer: Self-pay

## 2017-11-22 ENCOUNTER — Emergency Department (HOSPITAL_COMMUNITY): Payer: Medicare Other

## 2017-11-22 DIAGNOSIS — Z79899 Other long term (current) drug therapy: Secondary | ICD-10-CM | POA: Insufficient documentation

## 2017-11-22 DIAGNOSIS — S62525A Nondisplaced fracture of distal phalanx of left thumb, initial encounter for closed fracture: Secondary | ICD-10-CM

## 2017-11-22 DIAGNOSIS — R42 Dizziness and giddiness: Secondary | ICD-10-CM | POA: Insufficient documentation

## 2017-11-22 DIAGNOSIS — I10 Essential (primary) hypertension: Secondary | ICD-10-CM | POA: Insufficient documentation

## 2017-11-22 DIAGNOSIS — S0181XA Laceration without foreign body of other part of head, initial encounter: Secondary | ICD-10-CM

## 2017-11-22 DIAGNOSIS — S098XXA Other specified injuries of head, initial encounter: Secondary | ICD-10-CM | POA: Diagnosis not present

## 2017-11-22 DIAGNOSIS — S199XXA Unspecified injury of neck, initial encounter: Secondary | ICD-10-CM | POA: Diagnosis not present

## 2017-11-22 DIAGNOSIS — W1830XA Fall on same level, unspecified, initial encounter: Secondary | ICD-10-CM | POA: Diagnosis not present

## 2017-11-22 DIAGNOSIS — Y9369 Activity, other involving other sports and athletics played as a team or group: Secondary | ICD-10-CM | POA: Diagnosis not present

## 2017-11-22 DIAGNOSIS — Y929 Unspecified place or not applicable: Secondary | ICD-10-CM | POA: Diagnosis not present

## 2017-11-22 DIAGNOSIS — Y999 Unspecified external cause status: Secondary | ICD-10-CM | POA: Insufficient documentation

## 2017-11-22 DIAGNOSIS — R51 Headache: Secondary | ICD-10-CM | POA: Diagnosis not present

## 2017-11-22 DIAGNOSIS — S6991XA Unspecified injury of right wrist, hand and finger(s), initial encounter: Secondary | ICD-10-CM | POA: Diagnosis not present

## 2017-11-22 DIAGNOSIS — S0993XA Unspecified injury of face, initial encounter: Secondary | ICD-10-CM | POA: Diagnosis not present

## 2017-11-22 DIAGNOSIS — Z87891 Personal history of nicotine dependence: Secondary | ICD-10-CM | POA: Diagnosis not present

## 2017-11-22 DIAGNOSIS — S0990XA Unspecified injury of head, initial encounter: Secondary | ICD-10-CM | POA: Diagnosis not present

## 2017-11-22 DIAGNOSIS — S62522A Displaced fracture of distal phalanx of left thumb, initial encounter for closed fracture: Secondary | ICD-10-CM | POA: Diagnosis not present

## 2017-11-22 DIAGNOSIS — M542 Cervicalgia: Secondary | ICD-10-CM | POA: Diagnosis not present

## 2017-11-22 LAB — CBC WITH DIFFERENTIAL/PLATELET
Basophils Absolute: 0 10*3/uL (ref 0.0–0.1)
Basophils Relative: 0 %
Eosinophils Absolute: 0 10*3/uL (ref 0.0–0.7)
Eosinophils Relative: 0 %
HEMATOCRIT: 39.2 % (ref 39.0–52.0)
HEMOGLOBIN: 13.9 g/dL (ref 13.0–17.0)
LYMPHS ABS: 1.3 10*3/uL (ref 0.7–4.0)
LYMPHS PCT: 17 %
MCH: 32.8 pg (ref 26.0–34.0)
MCHC: 35.5 g/dL (ref 30.0–36.0)
MCV: 92.5 fL (ref 78.0–100.0)
MONOS PCT: 7 %
Monocytes Absolute: 0.5 10*3/uL (ref 0.1–1.0)
NEUTROS ABS: 5.9 10*3/uL (ref 1.7–7.7)
NEUTROS PCT: 76 %
Platelets: 174 10*3/uL (ref 150–400)
RBC: 4.24 MIL/uL (ref 4.22–5.81)
RDW: 13 % (ref 11.5–15.5)
WBC: 7.7 10*3/uL (ref 4.0–10.5)

## 2017-11-22 LAB — URINALYSIS, ROUTINE W REFLEX MICROSCOPIC
Bilirubin Urine: NEGATIVE
Glucose, UA: NEGATIVE mg/dL
HGB URINE DIPSTICK: NEGATIVE
Ketones, ur: NEGATIVE mg/dL
Leukocytes, UA: NEGATIVE
Nitrite: NEGATIVE
PH: 7 (ref 5.0–8.0)
Protein, ur: NEGATIVE mg/dL
SPECIFIC GRAVITY, URINE: 1.003 — AB (ref 1.005–1.030)

## 2017-11-22 LAB — COMPREHENSIVE METABOLIC PANEL WITH GFR
ALT: 23 U/L (ref 17–63)
AST: 33 U/L (ref 15–41)
Albumin: 4 g/dL (ref 3.5–5.0)
Alkaline Phosphatase: 29 U/L — ABNORMAL LOW (ref 38–126)
Anion gap: 10 (ref 5–15)
BUN: 18 mg/dL (ref 6–20)
CO2: 24 mmol/L (ref 22–32)
Calcium: 9.3 mg/dL (ref 8.9–10.3)
Chloride: 98 mmol/L — ABNORMAL LOW (ref 101–111)
Creatinine, Ser: 0.93 mg/dL (ref 0.61–1.24)
GFR calc Af Amer: >60 mL/min
GFR calc non Af Amer: >60 mL/min
Glucose, Bld: 103 mg/dL — ABNORMAL HIGH (ref 65–99)
Potassium: 4 mmol/L (ref 3.5–5.1)
Sodium: 132 mmol/L — ABNORMAL LOW (ref 135–145)
Total Bilirubin: 0.9 mg/dL (ref 0.3–1.2)
Total Protein: 6.9 g/dL (ref 6.5–8.1)

## 2017-11-22 LAB — I-STAT TROPONIN, ED: Troponin i, poc: 0.01 ng/mL (ref 0.00–0.08)

## 2017-11-22 MED ORDER — SODIUM CHLORIDE 0.9 % IV BOLUS
1000.0000 mL | Freq: Once | INTRAVENOUS | Status: AC
  Administered 2017-11-22: 1000 mL via INTRAVENOUS

## 2017-11-22 NOTE — ED Provider Notes (Signed)
[facility] DEPT Provider Note   CSN: [id number] Arrival date & time: 11/22/17  1529     History   Chief Complaint Chief Complaint  Patient presents with  . Facial Injury  . Fall    HPI Thomas Craig is a 72 y.o. male history of BPH, hypertension who presented with fall. Patient states that he was playing kickball with his family earlier today and his right leg gave out and he fell and hit his face and right hand and left thumb.  He states that afterwards, he felt lightheaded and dizzy.  He denies passing out prior to this event or chest pain.  Patient then had another episodes of near syncope afterwards.  Patient denies history of syncope or heart problems.  Patient states that his tetanus is up-to-date.   The history is provided by the patient.    Past Medical History:  Diagnosis Date  . BPH (benign prostatic hyperplasia) 09/05/2014   Saw palmetto has improved nocturia 3-5x a night to 1-2x a night.    . Bunion of great toe of left foot 10/20/2011   Hallux valgus shift on left with some early changes starting on RT   . Melanoma of skin ([place]) 09/05/2014   Dr. Allyson Sabal. Every 6 month follow up.      Patient Active Problem List   Diagnosis Date Noted  . Hematuria 06/19/2016  . History of melanoma 09/05/2014  . BPH (benign prostatic hyperplasia) 09/05/2014  . Essential hypertension 09/05/2014  . Hyperlipidemia 09/05/2014  . Bunion of great toe of left foot 10/20/2011  . Acute right ankle pain 09/29/2011    Past Surgical History:  Procedure Laterality Date  . MELANOMA EXCISION          Home Medications    Prior to Admission medications   Medication Sig Start Date End Date Taking? Authorizing Provider  amLODipine (NORVASC) 5 MG tablet Take 1 tablet (5 mg total) by mouth daily. 06/23/17   Marin Olp, MD  Ascorbic Acid (VITAMIN C) 1000 MG tablet Take 1,000 mg by mouth daily. 1-3 qd     [provider]  aspirin 325 MG  tablet Take 325 mg by mouth daily.      [provider]  b complex vitamins capsule Take 1 capsule by mouth daily.    [provider]  Calcium Carbonate-Vitamin D (CALCIUM-VITAMIN D) 500-200 MG-UNIT per tablet Take 1 tablet by mouth daily.      [provider]  cyanocobalamin 500 MCG tablet Take 500 mcg by mouth daily.    [provider]  Glucosamine-Chondroit-Vit C-Mn (GLUCOSAMINE CHONDR 1500 COMPLX) CAPS Take 1 capsule by mouth daily.    [provider]  Multiple Vitamin (MULTIVITAMIN) tablet Take 1 tablet by mouth daily.      [provider]  multivitamin-lutein (OCUVITE-LUTEIN) CAPS Take 1 capsule by mouth daily.    [provider]  OMEGA-3 1000 MG CAPS Take 4 capsules by mouth daily.      [provider]  Saw Palmetto, Serenoa repens, 1000 MG CAPS Take by mouth 1 day or 1 dose.     [provider]    Family History Family History  Problem Relation Age of Onset  . Alzheimer's disease Mother        lived to 76  . Melanoma Father   . Alzheimer's disease Father        lived to 48  . Anxiety disorder Sister  agoraphobia    Social History Social History   Tobacco Use  . Smoking status: Former Smoker    Packs/day: 0.75    Years: 5.00    Pack years: 3.75    Types: Cigarettes    Last attempt to quit: 08/05/1975    Years since quitting: 42.3  . Smokeless tobacco: Never Used  . Tobacco comment: ONLY SMOKED FOR 5 Y EARS or less with limited cigarettes  Substance Use Topics  . Alcohol use: Yes    Alcohol/week: 6.0 - 8.4 oz    Types: 10 - 14 Standard drinks or equivalent per week  . Drug use: No     Allergies   Sulfa antibiotics and Doxycycline   Review of Systems Review of Systems  Skin: Positive for wound.  Neurological: Positive for dizziness.  All other systems reviewed and are negative.    Physical Exam Updated Vital Signs BP (!) 149/88 (BP Location: Left Arm)   Pulse 61   Temp  98.3 F (36.8 C) (Oral)   Resp 16   Ht (S) 5\' 8"  (1.727 m)   Wt (S) 70.3 kg (155 lb)   SpO2 100%   BMI 23.57 kg/m   Physical Exam  Constitutional: He is oriented to person, place, and time. He appears well-developed and well-nourished.  HENT:  3 cm laceration R face, not involving the eyes or eyebrows. No jaw tenderness   Eyes: Pupils are equal, round, and reactive to light. Conjunctivae and EOM are normal.  Neck: Normal range of motion. Neck supple.  C collar in place   Cardiovascular: Normal rate, regular rhythm and normal heart sounds.  Pulmonary/Chest: Effort normal and breath sounds normal. No stridor. No respiratory distress. He has no wheezes.  Abdominal: Soft. Bowel sounds are normal. He exhibits no distension. There is no tenderness. There is no guarding.  Musculoskeletal:  Road rash R palm, L thumb swollen and dec ROM.   Neurological: He is alert and oriented to person, place, and time.  Skin: Skin is warm.  Psychiatric: He has a normal mood and affect.  Nursing note and vitals reviewed.    ED Treatments / Results  Labs (all labs ordered are listed, but only abnormal results are displayed) Labs Reviewed  CBC WITH DIFFERENTIAL/PLATELET  COMPREHENSIVE METABOLIC PANEL  URINALYSIS, ROUTINE W REFLEX MICROSCOPIC  I-STAT TROPONIN, ED    EKG None  Radiology No results found.  Procedures Procedures (including critical care time)  LACERATION REPAIR Performed by: Wandra Arthurs Authorized by: Wandra Arthurs Consent: Verbal consent obtained. Risks and benefits: risks, benefits and alternatives were discussed Consent given by: patient Patient identity confirmed: provided demographic data Prepped and Draped in normal sterile fashion Wound explored  Laceration Location: R face  Laceration Length: 3 cm  No Foreign Bodies seen or palpated  Anesthesia: none  Local anesthetic: none  Irrigation method: syringe Amount of cleaning: standard  Skin closure:  dermabond   Patient tolerance: Patient tolerated the procedure well with no immediate complications.   Medications Ordered in ED Medications  sodium chloride 0.9 % bolus 1,000 mL (has no administration in time range)     Initial Impression / Assessment and Plan / ED Course  I have reviewed the triage vital signs and the nursing notes.  Pertinent labs & imaging results that were available during my care of the patient were reviewed by me and considered in my medical decision making (see chart for details).     Thomas Craig is a 72  y.o. male here with head injury with possible LOC afterwards. Denies syncope prior to the event. Tdap up to date. Will get labs, EKG, CT head/neck, xrays.   6:10 PM CT head/neck unremarkable. Laceration dermabonded. Labs unremarkable. Has L thumb fracture and finger splint placed, will have him follow up with hand surgery.    Final Clinical Impressions(s) / ED Diagnoses   Final diagnoses:  None    ED Discharge Orders    None       Drenda Freeze, MD 11/22/17 1810

## 2017-11-22 NOTE — ED Notes (Signed)
Bed: MS11 Expected date:  Expected time:  Means of arrival:  Comments: 72 y/o male w/ laceration to face and fall from playing kickball

## 2017-11-22 NOTE — Discharge Instructions (Addendum)
Take tylenol, motrin for pain.   The dermabond will dissolve in about a week.   See hand surgeon in a week for repeat thumb xray   See your doctor  Return to ER if you have headaches, vomiting, severe thumb pain or swelling.

## 2017-11-22 NOTE — ED Notes (Signed)
Pt given urinal and made aware of need for urine 

## 2017-11-22 NOTE — ED Triage Notes (Signed)
Pt is AOx4 and ambulatory. Pt was wearing glasses. Pt playing Kickball and slipped on pebbles and fell on face, Pt ambulated to house and had syncopal episode in house. Family called 911 right after.   Pt does have Hx of syncopal episodes due to hypoglycemia.

## 2017-11-22 NOTE — ED Triage Notes (Signed)
Pt from home via EMS after falling on the road while playing kickball. Pt has laceration to R cheek and deep abrasion to palm of R hand. Pt sts that he did not have LOC, but sts, "I think I passed out for a few seconds." Pt is A&O and in NAD. Bleeding is controlled.

## 2017-12-01 ENCOUNTER — Encounter: Payer: Self-pay | Admitting: Gastroenterology

## 2017-12-02 DIAGNOSIS — M79645 Pain in left finger(s): Secondary | ICD-10-CM | POA: Diagnosis not present

## 2017-12-21 ENCOUNTER — Ambulatory Visit: Payer: Medicare Other | Admitting: Family Medicine

## 2017-12-30 DIAGNOSIS — M79645 Pain in left finger(s): Secondary | ICD-10-CM | POA: Diagnosis not present

## 2018-01-01 ENCOUNTER — Encounter: Payer: Self-pay | Admitting: Family Medicine

## 2018-01-01 ENCOUNTER — Ambulatory Visit (INDEPENDENT_AMBULATORY_CARE_PROVIDER_SITE_OTHER): Payer: Medicare Other | Admitting: Family Medicine

## 2018-01-01 VITALS — BP 134/88 | HR 62 | Temp 97.7°F | Ht 68.0 in | Wt 149.6 lb

## 2018-01-01 DIAGNOSIS — E785 Hyperlipidemia, unspecified: Secondary | ICD-10-CM | POA: Diagnosis not present

## 2018-01-01 DIAGNOSIS — N401 Enlarged prostate with lower urinary tract symptoms: Secondary | ICD-10-CM

## 2018-01-01 DIAGNOSIS — R351 Nocturia: Secondary | ICD-10-CM

## 2018-01-01 DIAGNOSIS — I10 Essential (primary) hypertension: Secondary | ICD-10-CM

## 2018-01-01 NOTE — Patient Instructions (Addendum)
Health Maintenance Due  Topic Date Due  . COLONOSCOPY - Scheduled for July 10th, 2019 10/27/2017   Schedule a lab visit at the check out desk within 2 weeks. Return for future fasting labs meaning nothing but water after midnight please. Ok to take your medications with water.   No changes today- other than thinking about whether you want to stop aspirin. Since you want to remain off cholesterol medicine- the aspirin probably gives you more benefit than the general population in reducing cardiac risk while your bleeding risk is roughly equivalent than general population (reassuring is recent head trauma without bleed)

## 2018-01-01 NOTE — Progress Notes (Signed)
Subjective:  Thomas Craig is a 72 y.o. year old very pleasant male patient who presents for/with See problem oriented charting ROS- No chest pain or shortness of breath. No headache or blurry vision. Nocturia noted.    Past Medical History-  Patient Active Problem List   Diagnosis Date Noted  . History of melanoma 09/05/2014    Priority: High  . BPH (benign prostatic hyperplasia) 09/05/2014    Priority: Medium  . Essential hypertension 09/05/2014    Priority: Medium  . Hyperlipidemia 09/05/2014    Priority: Medium  . Hematuria 06/19/2016    Priority: Low  . Bunion of great toe of left foot 10/20/2011    Priority: Low  . Acute right ankle pain 09/29/2011    Priority: Low    Medications- reviewed and updated Current Outpatient Medications  Medication Sig Dispense Refill  . amLODipine (NORVASC) 5 MG tablet Take 1 tablet (5 mg total) by mouth daily. 90 tablet 3  . Ascorbic Acid (VITAMIN C) 1000 MG tablet Take 1,000 mg by mouth daily. 1-3 qd     . aspirin 325 MG tablet Take 325 mg by mouth daily.      Marland Kitchen b complex vitamins capsule Take 1 capsule by mouth daily.    . Calcium Carbonate-Vitamin D (CALCIUM-VITAMIN D) 500-200 MG-UNIT per tablet Take 1 tablet by mouth daily.      . cyanocobalamin 500 MCG tablet Take 500 mcg by mouth daily.    . Glucosamine-Chondroit-Vit C-Mn (GLUCOSAMINE CHONDR 1500 COMPLX) CAPS Take 2 capsules by mouth daily.     . Multiple Vitamin (MULTIVITAMIN) tablet Take 1 tablet by mouth daily.      . multivitamin-lutein (OCUVITE-LUTEIN) CAPS Take 1 capsule by mouth daily.    . OMEGA-3 1000 MG CAPS Take 4 capsules by mouth daily.      . Saw Palmetto, Serenoa repens, 1000 MG CAPS Take by mouth 1 day or 1 dose.      No current facility-administered medications for this visit.     Objective: BP 134/88 (BP Location: Left Arm, Patient Position: Sitting, Cuff Size: Normal)   Pulse 62   Temp 97.7 F (36.5 C) (Oral)   Ht 5\' 8"  (1.727 m)   Wt 149 lb 9.6 oz (67.9  kg)   SpO2 99%   BMI 22.75 kg/m  Gen: NAD, resting comfortably CV: RRR no murmurs rubs or gallops Lungs: CTAB no crackles, wheeze, rhonchi Abdomen: soft/nontender/nondistended Ext: no edema Skin: warm, dry Neuro: Normal gait and speech  Assessment/Plan:  Essential hypertension S: controlled on  amlodipine 5mg  (increased at last visit). Home averages in mid 120s.  BP Readings from Last 3 Encounters:  01/01/18 134/88  11/22/17 (!) 157/80  06/23/17 (!) 158/90  A/P: We discussed blood pressure goal of <140/90. Continue current meds   BPH (benign prostatic hyperplasia) S:  peeing overall is down- using OTC saw palmetto type product. 1-2x a night nocturia. Years ago was 4-5 x a night Lab Results  Component Value Date   PSA 1.43 12/18/2016   PSA 1.11 09/03/2015   PSA 1.11 10/11/2012  A/P: continue current otc supplement- update PSA  Hyperlipidemia S: poorly controlled on no statin- he has wanted to work on diet/exercise Lab Results  Component Value Date   CHOL 209 (H) 12/18/2016   HDL 91.90 12/18/2016   LDLCALC 106 (H) 12/18/2016   LDLDIRECT 100.5 10/27/2011   TRIG 53.0 12/18/2016   CHOLHDL 2 12/18/2016   A/P: has been a year and will update lipid  panel. His 10 year risk is at 19% with last years lipids- he does not want to consider statin at this time. We discussed without statin, possibly greater benefit of asa but also bleeding risks. He is going to consider stopping asa after our discussion- I think his potential benefit is higher than average population with similar risk of bleeding     Future Appointments  Date Time Provider [place]  01/07/2018  9:00 AM [facility] LAB [facility]  01/18/2018 10:30 AM [facility] PREVISIT [facility]  02/10/2018  9:30 AM Armbruster, Carlota Raspberry, MD [facility]   6 month visit reasonable to monitor BPs. Definitely wouldn't want to go longer than a year.   Lab/Order associations: Benign prostatic hyperplasia with  nocturia - Plan: PSA  Hyperlipidemia, unspecified hyperlipidemia type - Plan: Lipid panel, Comprehensive metabolic panel  Return precautions advised.  Garret Reddish, MD

## 2018-01-02 NOTE — Assessment & Plan Note (Signed)
S:  peeing overall is down- using OTC saw palmetto type product. 1-2x a night nocturia. Years ago was 4-5 x a night Lab Results  Component Value Date   PSA 1.43 12/18/2016   PSA 1.11 09/03/2015   PSA 1.11 10/11/2012  A/P: continue current otc supplement- update PSA

## 2018-01-02 NOTE — Assessment & Plan Note (Addendum)
S: controlled on  amlodipine 5mg  (increased at last visit). Home averages in mid 120s.  BP Readings from Last 3 Encounters:  01/01/18 134/88  11/22/17 (!) 157/80  06/23/17 (!) 158/90  A/P: We discussed blood pressure goal of <140/90. Continue current meds

## 2018-01-02 NOTE — Assessment & Plan Note (Signed)
S: poorly controlled on no statin- he has wanted to work on diet/exercise Lab Results  Component Value Date   CHOL 209 (H) 12/18/2016   HDL 91.90 12/18/2016   LDLCALC 106 (H) 12/18/2016   LDLDIRECT 100.5 10/27/2011   TRIG 53.0 12/18/2016   CHOLHDL 2 12/18/2016   A/P: has been a year and will update lipid panel. His 10 year risk is at 19% with last years lipids- he does not want to consider statin at this time. We discussed without statin, possibly greater benefit of asa but also bleeding risks. He is going to consider stopping asa after our discussion- I think his potential benefit is higher than average population with similar risk of bleeding

## 2018-01-07 ENCOUNTER — Other Ambulatory Visit (INDEPENDENT_AMBULATORY_CARE_PROVIDER_SITE_OTHER): Payer: Medicare Other

## 2018-01-07 DIAGNOSIS — E785 Hyperlipidemia, unspecified: Secondary | ICD-10-CM

## 2018-01-07 DIAGNOSIS — N401 Enlarged prostate with lower urinary tract symptoms: Secondary | ICD-10-CM

## 2018-01-07 DIAGNOSIS — R351 Nocturia: Secondary | ICD-10-CM | POA: Diagnosis not present

## 2018-01-07 LAB — LIPID PANEL
CHOLESTEROL: 208 mg/dL — AB (ref 0–200)
HDL: 84.7 mg/dL (ref >39.00)
LDL CALC: 115 mg/dL — AB (ref 0–99)
NONHDL: 123.13
Total CHOL/HDL Ratio: 2
Triglycerides: 43 mg/dL (ref 0.0–149.0)
VLDL: 8.6 mg/dL (ref 0.0–40.0)

## 2018-01-07 LAB — COMPREHENSIVE METABOLIC PANEL
ALBUMIN: 4.2 g/dL (ref 3.5–5.2)
ALT: 28 U/L (ref 0–53)
AST: 27 U/L (ref 0–37)
Alkaline Phosphatase: 31 U/L — ABNORMAL LOW (ref 39–117)
BILIRUBIN TOTAL: 0.9 mg/dL (ref 0.2–1.2)
BUN: 20 mg/dL (ref 6–23)
CALCIUM: 9.4 mg/dL (ref 8.4–10.5)
CO2: 28 mEq/L (ref 19–32)
CREATININE: 0.85 mg/dL (ref 0.40–1.50)
Chloride: 99 mEq/L (ref 96–112)
GFR: 94.32 mL/min (ref >60.00)
Glucose, Bld: 85 mg/dL (ref 70–99)
Potassium: 4.2 mEq/L (ref 3.5–5.1)
SODIUM: 134 meq/L — AB (ref 135–145)
TOTAL PROTEIN: 6.4 g/dL (ref 6.0–8.3)

## 2018-01-07 LAB — PSA: PSA: 1.54 ng/mL (ref 0.10–4.00)

## 2018-01-07 NOTE — Progress Notes (Signed)
Your CMET was largely stable/normal (kidney, liver, and electrolytes, blood sugar). Sodium a hair low as per last few checks.  Your cholesterol remains elevated with 10 year heart attack or stroke risk at nearly 20%. This is certainly the range I suggest cholesterol medicine but you told me at the visit you were not interested.  Your PSA was stbale over the last year- low risk trend for prostate cancer

## 2018-01-18 ENCOUNTER — Ambulatory Visit (AMBULATORY_SURGERY_CENTER): Payer: Self-pay | Admitting: *Deleted

## 2018-01-18 ENCOUNTER — Other Ambulatory Visit: Payer: Self-pay

## 2018-01-18 VITALS — Ht 66.0 in | Wt 149.2 lb

## 2018-01-18 DIAGNOSIS — Z1211 Encounter for screening for malignant neoplasm of colon: Secondary | ICD-10-CM

## 2018-01-18 NOTE — Progress Notes (Signed)
Patient denies any allergies to egg or soy products. Patient denies complications with anesthesia/sedation.  Patient denies oxygen use at home and denies diet medications. Patient denies information on colonoscopy. 

## 2018-02-10 ENCOUNTER — Encounter: Payer: Self-pay | Admitting: Gastroenterology

## 2018-02-10 ENCOUNTER — Other Ambulatory Visit: Payer: Self-pay

## 2018-02-10 ENCOUNTER — Ambulatory Visit (AMBULATORY_SURGERY_CENTER): Payer: Medicare Other | Admitting: Gastroenterology

## 2018-02-10 VITALS — BP 116/70 | HR 47 | Temp 97.8°F | Resp 16 | Ht 68.0 in | Wt 149.0 lb

## 2018-02-10 DIAGNOSIS — D122 Benign neoplasm of ascending colon: Secondary | ICD-10-CM | POA: Diagnosis not present

## 2018-02-10 DIAGNOSIS — Z1211 Encounter for screening for malignant neoplasm of colon: Secondary | ICD-10-CM | POA: Diagnosis present

## 2018-02-10 DIAGNOSIS — D120 Benign neoplasm of cecum: Secondary | ICD-10-CM | POA: Diagnosis not present

## 2018-02-10 HISTORY — PX: COLONOSCOPY WITH PROPOFOL: SHX5780

## 2018-02-10 MED ORDER — SODIUM CHLORIDE 0.9 % IV SOLN: 500.0000 mL | Freq: Once | INTRAVENOUS | Status: DC

## 2018-02-10 NOTE — Op Note (Signed)
[facility] Patient Name: Thomas Craig Procedure Date: 02/10/2018 9:35 AM MRN: [id number] Endoscopist: Remo Lipps P. Havery [name] , MD Age: 72 Referring MD:  Date of Birth: 10-12-1945 Gender: Male Account #: [id number] Procedure:                Colonoscopy Indications:              Screening for colorectal malignant neoplasm Medicines:                Monitored Anesthesia Care Procedure:                Pre-Anesthesia Assessment:                           - Prior to the procedure, a History and Physical                            was performed, and patient medications and                            allergies were reviewed. The patient's tolerance of                            previous anesthesia was also reviewed. The risks                            and benefits of the procedure and the sedation                            options and risks were discussed with the patient.                            All questions were answered, and informed consent                            was obtained. Prior Anticoagulants: The patient has                            taken no previous anticoagulant or antiplatelet                            agents. ASA Grade Assessment: II - A patient with                            mild systemic disease. After reviewing the risks                            and benefits, the patient was deemed in                            satisfactory condition to undergo the procedure.                           After obtaining informed consent, the colonoscope  was passed under direct vision. Throughout the                            procedure, the patient's blood pressure, pulse, and                            oxygen saturations were monitored continuously. The                            Colonoscope was introduced through the anus and                            advanced to the the cecum, identified by                            appendiceal orifice  and ileocecal valve. The                            colonoscopy was performed without difficulty. The                            patient tolerated the procedure well. The quality                            of the bowel preparation was adequate. The                            ileocecal valve, appendiceal orifice, and rectum                            were photographed. Scope In: 9:39:16 AM Scope Out: 10:03:48 AM Scope Withdrawal Time: 0 hours 21 minutes 38 seconds  Total Procedure Duration: 0 hours 24 minutes 32 seconds  Findings:                 The perianal and digital rectal examinations were                            normal.                           Two sessile polyps were found in the cecum. The                            polyps were diminutive in size. These polyps were                            removed with a cold biopsy forceps. Resection and                            retrieval were complete.                           Five sessile polyps were found in the ascending  colon. The polyps were 3 to 8 mm in size. These                            polyps were removed with a cold snare. Resection                            and retrieval were complete.                           A few small-mouthed diverticula were found in the                            sigmoid colon.                           The left colon was tortuous.                           The exam was otherwise without abnormality. Complications:            No immediate complications. Estimated blood loss:                            Minimal. Estimated Blood Loss:     Estimated blood loss was minimal. Impression:               - Two diminutive polyps in the cecum, removed with                            a cold biopsy forceps. Resected and retrieved.                           - Four 3 to 8 mm polyps in the ascending colon,                            removed with a cold snare. Resected and retrieved.                            - Diverticulosis in the sigmoid colon.                           - Tortuous colon.                           - The examination was otherwise normal. Recommendation:           - Patient has a contact number available for                            emergencies. The signs and symptoms of potential                            delayed complications were discussed with the                            patient. Return  to normal activities tomorrow.                            Written discharge instructions were provided to the                            patient.                           - Resume previous diet.                           - Continue present medications.                           - Await pathology results.                           - Repeat colonoscopy for surveillance based on                            pathology results. Remo Lipps P. Havery [name], MD 02/10/2018 10:08:54 AM This report has been signed electronically.

## 2018-02-10 NOTE — Patient Instructions (Signed)
YOU HAD AN ENDOSCOPIC PROCEDURE TODAY AT THE [facility]:   Refer to the procedure report that was given to you for any specific questions about what was found during the examination.  If the procedure report does not answer your questions, please call your gastroenterologist to clarify.  If you requested that your care partner not be given the details of your procedure findings, then the procedure report has been included in a sealed envelope for you to review at your convenience later.  YOU SHOULD EXPECT: Some feelings of bloating in the abdomen. Passage of more gas than usual.  Walking can help get rid of the air that was put into your GI tract during the procedure and reduce the bloating. If you had a lower endoscopy (such as a colonoscopy or flexible sigmoidoscopy) you may notice spotting of blood in your stool or on the toilet paper. If you underwent a bowel prep for your procedure, you may not have a normal bowel movement for a few days.  Please Note:  You might notice some irritation and congestion in your nose or some drainage.  This is from the oxygen used during your procedure.  There is no need for concern and it should clear up in a day or so.  SYMPTOMS TO REPORT IMMEDIATELY:   Following lower endoscopy (colonoscopy or flexible sigmoidoscopy):  Excessive amounts of blood in the stool  Significant tenderness or worsening of abdominal pains  Swelling of the abdomen that is new, acute  Fever of 100F or higher  Please see handouts given to you on Polyps and Diverticulosis.  For urgent or emergent issues, a gastroenterologist can be reached at any hour by calling [phone].   DIET:  We do recommend a small meal at first, but then you may proceed to your regular diet.  Drink plenty of fluids but you should avoid alcoholic beverages for 24 hours.  ACTIVITY:  You should plan to take it easy for the rest of today and you should NOT DRIVE or use heavy machinery until  tomorrow (because of the sedation medicines used during the test).    FOLLOW UP: Our staff will call the number listed on your records the next business day following your procedure to check on you and address any questions or concerns that you may have regarding the information given to you following your procedure. If we do not reach you, we will leave a message.  However, if you are feeling well and you are not experiencing any problems, there is no need to return our call.  We will assume that you have returned to your regular daily activities without incident.  If any biopsies were taken you will be contacted by phone or by letter within the next 1-3 weeks.  Please call us at [phone] if you have not heard about the biopsies in 3 weeks.    SIGNATURES/CONFIDENTIALITY: You and/or your care partner have signed paperwork which will be entered into your electronic medical record.  These signatures attest to the fact that that the information above on your After Visit Summary has been reviewed and is understood.  Full responsibility of the confidentiality of this discharge information lies with you and/or your care-partner.  Thank you for letting us take care of your healthcare needs today. 

## 2018-02-10 NOTE — Progress Notes (Signed)
Pt's states no medical or surgical changes since previsit or office visit. 

## 2018-02-10 NOTE — Progress Notes (Signed)
To PACU, VSS. Report to [email or web address] 

## 2018-02-11 ENCOUNTER — Telehealth: Payer: Self-pay

## 2018-02-11 ENCOUNTER — Telehealth: Payer: Self-pay | Admitting: *Deleted

## 2018-02-11 NOTE — Telephone Encounter (Signed)
  Follow up Call-  Call back number 02/10/2018  Post procedure Call Back phone  # [phone]  Permission to leave phone message Yes  Some recent data might be hidden     Patient questions:  Do you have a fever, pain , or abdominal swelling? No. Pain Score  0 *  Have you tolerated food without any problems? Yes.    Have you been able to return to your normal activities? Yes.    Do you have any questions about your discharge instructions: Diet   No. Medications  No. Follow up visit  No.  Do you have questions or concerns about your Care? No.  Actions: * If pain score is 4 or above: No action needed, pain <4.

## 2018-02-11 NOTE — Telephone Encounter (Signed)
Attempted to reach patient for post-procedure f/u call. No answer. Left message that we will make another attempt to reach him later today and for him to please not hesitate to call us if he has any questions/concerns regarding his care. 

## 2018-02-16 ENCOUNTER — Encounter: Payer: Self-pay | Admitting: Gastroenterology

## 2018-02-23 ENCOUNTER — Encounter: Payer: Self-pay | Admitting: Family Medicine

## 2018-02-23 DIAGNOSIS — Z86010 Personal history of colonic polyps: Secondary | ICD-10-CM | POA: Insufficient documentation

## 2018-03-04 ENCOUNTER — Other Ambulatory Visit: Payer: Self-pay

## 2018-03-25 DIAGNOSIS — L819 Disorder of pigmentation, unspecified: Secondary | ICD-10-CM | POA: Diagnosis not present

## 2018-03-25 DIAGNOSIS — L814 Other melanin hyperpigmentation: Secondary | ICD-10-CM | POA: Diagnosis not present

## 2018-03-25 DIAGNOSIS — L570 Actinic keratosis: Secondary | ICD-10-CM | POA: Diagnosis not present

## 2018-03-25 DIAGNOSIS — D1801 Hemangioma of skin and subcutaneous tissue: Secondary | ICD-10-CM | POA: Diagnosis not present

## 2018-03-25 DIAGNOSIS — L821 Other seborrheic keratosis: Secondary | ICD-10-CM | POA: Diagnosis not present

## 2018-03-25 DIAGNOSIS — L218 Other seborrheic dermatitis: Secondary | ICD-10-CM | POA: Diagnosis not present

## 2018-03-25 DIAGNOSIS — Z85820 Personal history of malignant melanoma of skin: Secondary | ICD-10-CM | POA: Diagnosis not present

## 2018-03-30 ENCOUNTER — Encounter: Payer: Self-pay | Admitting: Family Medicine

## 2018-04-12 ENCOUNTER — Telehealth: Payer: Self-pay

## 2018-04-12 NOTE — Telephone Encounter (Signed)
Copied from [place] [id number]. Topic: Appointment Scheduling - Scheduling Inquiry for Clinic >> Apr 12, 2018  4:23 PM Bea Graff, NT wrote: Reason for CRM: Pt is requesting an appt for his shingles vaccine. Please call to schedule.

## 2018-04-13 NOTE — Telephone Encounter (Signed)
Called and left a voicemail message requesting patient return my call. He needs to go to the pharmacy to receive these injections due to insurance.

## 2018-05-05 DIAGNOSIS — H2513 Age-related nuclear cataract, bilateral: Secondary | ICD-10-CM | POA: Diagnosis not present

## 2018-06-10 ENCOUNTER — Encounter: Payer: Self-pay | Admitting: Family Medicine

## 2018-06-10 ENCOUNTER — Other Ambulatory Visit: Payer: Self-pay | Admitting: Family Medicine

## 2018-06-18 DIAGNOSIS — Z23 Encounter for immunization: Secondary | ICD-10-CM | POA: Diagnosis not present

## 2018-06-24 ENCOUNTER — Other Ambulatory Visit: Payer: Self-pay

## 2018-07-08 ENCOUNTER — Encounter: Payer: Medicare Other | Admitting: Family Medicine

## 2018-09-07 ENCOUNTER — Encounter

## 2018-09-07 ENCOUNTER — Encounter: Payer: Self-pay | Admitting: Family Medicine

## 2018-09-07 ENCOUNTER — Ambulatory Visit (INDEPENDENT_AMBULATORY_CARE_PROVIDER_SITE_OTHER): Payer: Medicare Other | Admitting: Family Medicine

## 2018-09-07 VITALS — BP 140/80 | HR 60 | Temp 97.4°F | Ht 65.5 in | Wt 149.4 lb

## 2018-09-07 DIAGNOSIS — I10 Essential (primary) hypertension: Secondary | ICD-10-CM | POA: Diagnosis not present

## 2018-09-07 DIAGNOSIS — Z87891 Personal history of nicotine dependence: Secondary | ICD-10-CM

## 2018-09-07 DIAGNOSIS — E785 Hyperlipidemia, unspecified: Secondary | ICD-10-CM

## 2018-09-07 DIAGNOSIS — R351 Nocturia: Secondary | ICD-10-CM

## 2018-09-07 DIAGNOSIS — N401 Enlarged prostate with lower urinary tract symptoms: Secondary | ICD-10-CM | POA: Diagnosis not present

## 2018-09-07 NOTE — Progress Notes (Signed)
Phone [phone]   Subjective:  Thomas Craig is a 73 y.o. year old very pleasant male patient who presents for/with See problem oriented charting ROS- No chest pain or shortness of breath. No headache or blurry vision.  No new skin lesions noted  Past Medical History-  Patient Active Problem List   Diagnosis Date Noted  . History of melanoma 09/05/2014    Priority: High  . History of adenomatous polyp of colon 02/23/2018    Priority: Medium  . BPH (benign prostatic hyperplasia) 09/05/2014    Priority: Medium  . Essential hypertension 09/05/2014    Priority: Medium  . Hyperlipidemia 09/05/2014    Priority: Medium  . Hematuria 06/19/2016    Priority: Low  . Bunion of great toe of left foot 10/20/2011    Priority: Low  . Acute right ankle pain 09/29/2011    Priority: Low    Medications- reviewed and updated Current Outpatient Medications  Medication Sig Dispense Refill  . amLODipine (NORVASC) 5 MG tablet TAKE 1 TABLET ONCE DAILY. 90 tablet 3  . Ascorbic Acid (VITAMIN C) 1000 MG tablet Take 1,000 mg by mouth daily. 1-3 qd     . aspirin 325 MG tablet Take 325 mg by mouth daily.      Marland Kitchen b complex vitamins capsule Take 1 capsule by mouth daily.    . Calcium Carbonate-Vitamin D (CALCIUM-VITAMIN D) 500-200 MG-UNIT per tablet Take 1 tablet by mouth daily.      . cyanocobalamin 500 MCG tablet Take 500 mcg by mouth daily.    . Glucosamine-Chondroit-Vit C-Mn (GLUCOSAMINE CHONDR 1500 COMPLX) CAPS Take 2 capsules by mouth daily.     . Multiple Vitamin (MULTIVITAMIN) tablet Take 1 tablet by mouth daily.      . multivitamin-lutein (OCUVITE-LUTEIN) CAPS Take 1 capsule by mouth daily.    . OMEGA-3 1000 MG CAPS Take 4 capsules by mouth daily.      . Saw Palmetto, Serenoa repens, 1000 MG CAPS Take by mouth 1 day or 1 dose.      Current Facility-Administered Medications  Medication Dose Route Frequency Provider Last Rate Last Dose  . 0.9 %  sodium chloride infusion  500 mL Intravenous  Once Armbruster, Carlota Raspberry, MD         Objective:  BP 140/80 (BP Location: Left Arm, Patient Position: Sitting, Cuff Size: Normal)   Pulse 60   Temp (!) 97.4 F (36.3 C) (Oral)   Ht 5' 5.5" (1.664 m)   Wt 149 lb 6.4 oz (67.8 kg)   SpO2 100%   BMI 24.48 kg/m  Gen: NAD, resting comfortably CV: RRR no murmurs rubs or gallops Lungs: CTAB no crackles, wheeze, rhonchi Abdomen: soft/nontender/nondistended/normal bowel sounds. No rebound or guarding.  Ext: no edema Skin: warm, dry    Assessment and Plan  73 y.o. male presenting for Health Maintenance counseling: 1.   Immunizations/screenings/ancillary studies-need to discuss shingrix at pharmacy next visit- noted after visit had only had zostavax Immunization History  Administered Date(s) Administered  . Influenza Whole 07/04/2009  . Influenza,inj,Quad PF,6+ Mos 04/20/2013, 03/19/2016, 05/25/2017  . Influenza-Unspecified 05/16/2014, 05/16/2015, 06/18/2018  . Pneumococcal Conjugate-13 09/05/2014  . Pneumococcal Polysaccharide-23 10/18/2012  . Td 08/04/2005  . Tdap 09/20/2015  . Zoster 10/18/2012  2.. Prostate cancer screening- past age based screening recommendations- he wants to do through age 64 given BPH with nocturia Lab Results  Component Value Date   PSA 1.54 01/07/2018   PSA 1.43 12/18/2016   PSA 1.11 09/03/2015  3.  Colon cancer screening -02/10/2018 with 3-year follow-up due to adenomatous poly history 4. Skin cancer screening-follows with Dr. Allyson Sabal due to history of melanoma.  Advised regular sunscreen use. Denies worrisome, changing, or new skin lesions.  5.  Former smoker-quit 1977-discussed AAA screen- he opts in    Status of chronic or acute concerns   #Hypertension S: Compliant with amlodipine 5 mg. Home BPs average over last 100 hav ebeen 128/76 A/P: Stable. Continue current medications-given excellent home numbers  #Hyperlipidemia S: Poorly controlled on no statin-has declined statin in the past.  Ten-year  risk over 20%. Does take fish oil A/P: Mild poor control-does have good ratios and HDL is very protective.  Calculated 10-year risk again using last years labs- at high 19% range when using home blood pressure average.  We discussed using once weekly statin once we get labs back versus considering coronary CT-patient is undecided-we will follow-up after labs   #BPH with nocturia S: Patient using over-the-counter saw palmetto type product.  Nocturia 1-2 times a night.  Peak of nocturia was 4-5 times a night before this medication. A/P: Continue current supplement.  Appears stable.  We will trend PSA through age 40 per patient preference.  We discussed the downside risk such as overdiagnosis/over testing  6-9 month follow up discussed Lab/Order associations: NOT FASTING- will return fasting Essential hypertension - Plan: CBC with Differential/Platelet, Comprehensive metabolic panel, Lipid panel  Hyperlipidemia, unspecified hyperlipidemia type - Plan: CBC with Differential/Platelet, Comprehensive metabolic panel, Lipid panel  BPH associated with nocturia - Plan: PSA  Former smoker - Plan: VAS US AORTA MEDICARE SCREEN  Return precautions advised.  Garret Reddish, MD

## 2018-09-07 NOTE — Patient Instructions (Addendum)
We will call you within two weeks about your referral for ultrasound of the aorta-one-time lifetime screening. If you do not hear within 3 weeks, give [place] a call.    Schedule a lab visit at the check out desk within a week. Return for future fasting labs meaning nothing but water after midnight please. Ok to take your medications with water.

## 2018-09-15 ENCOUNTER — Other Ambulatory Visit (INDEPENDENT_AMBULATORY_CARE_PROVIDER_SITE_OTHER): Payer: Medicare Other

## 2018-09-15 DIAGNOSIS — R351 Nocturia: Secondary | ICD-10-CM

## 2018-09-15 DIAGNOSIS — N401 Enlarged prostate with lower urinary tract symptoms: Secondary | ICD-10-CM

## 2018-09-15 DIAGNOSIS — E785 Hyperlipidemia, unspecified: Secondary | ICD-10-CM

## 2018-09-15 DIAGNOSIS — I10 Essential (primary) hypertension: Secondary | ICD-10-CM

## 2018-09-15 LAB — CBC WITH DIFFERENTIAL/PLATELET
Basophils Absolute: 0 10*3/uL (ref 0.0–0.1)
Basophils Relative: 0.8 % (ref 0.0–3.0)
Eosinophils Absolute: 0 10*3/uL (ref 0.0–0.7)
Eosinophils Relative: 1.1 % (ref 0.0–5.0)
HCT: 38.5 % — ABNORMAL LOW (ref 39.0–52.0)
Hemoglobin: 13.3 g/dL (ref 13.0–17.0)
Lymphocytes Relative: 29.4 % (ref 12.0–46.0)
Lymphs Abs: 0.9 10*3/uL (ref 0.7–4.0)
MCHC: 34.5 g/dL (ref 30.0–36.0)
MCV: 94 fl (ref 78.0–100.0)
MONO ABS: 0.2 10*3/uL (ref 0.1–1.0)
Monocytes Relative: 7.1 % (ref 3.0–12.0)
Neutro Abs: 2 10*3/uL (ref 1.4–7.7)
Neutrophils Relative %: 61.6 % (ref 43.0–77.0)
PLATELETS: 188 10*3/uL (ref 150.0–400.0)
RBC: 4.1 Mil/uL — ABNORMAL LOW (ref 4.22–5.81)
RDW: 13.3 % (ref 11.5–15.5)
WBC: 3.2 10*3/uL — ABNORMAL LOW (ref 4.0–10.5)

## 2018-09-15 LAB — LIPID PANEL
Cholesterol: 209 mg/dL — ABNORMAL HIGH (ref 0–200)
HDL: 75.5 mg/dL (ref >39.00)
LDL Cholesterol: 121 mg/dL — ABNORMAL HIGH (ref 0–99)
NonHDL: 133.69
Total CHOL/HDL Ratio: 3
Triglycerides: 63 mg/dL (ref 0.0–149.0)
VLDL: 12.6 mg/dL (ref 0.0–40.0)

## 2018-09-15 LAB — COMPREHENSIVE METABOLIC PANEL
ALT: 25 U/L (ref 0–53)
AST: 30 U/L (ref 0–37)
Albumin: 3.9 g/dL (ref 3.5–5.2)
Alkaline Phosphatase: 27 U/L — ABNORMAL LOW (ref 39–117)
BILIRUBIN TOTAL: 0.8 mg/dL (ref 0.2–1.2)
BUN: 24 mg/dL — ABNORMAL HIGH (ref 6–23)
CO2: 25 mEq/L (ref 19–32)
Calcium: 9 mg/dL (ref 8.4–10.5)
Chloride: 96 mEq/L (ref 96–112)
Creatinine, Ser: 1.05 mg/dL (ref 0.40–1.50)
GFR: 69.41 mL/min (ref >60.00)
Glucose, Bld: 92 mg/dL (ref 70–99)
Potassium: 4.2 mEq/L (ref 3.5–5.1)
Sodium: 133 mEq/L — ABNORMAL LOW (ref 135–145)
Total Protein: 6 g/dL (ref 6.0–8.3)

## 2018-09-15 LAB — PSA: PSA: 1.73 ng/mL (ref 0.10–4.00)

## 2018-09-15 MED ORDER — ATORVASTATIN CALCIUM 20 MG PO TABS: 20.0000 mg | ORAL_TABLET | Freq: Every day | ORAL | 3 refills | Status: DC

## 2018-09-15 NOTE — Addendum Note (Signed)
Addended by: Gwenyth Ober R on: 09/15/2018 05:11 PM   Modules accepted: Orders

## 2018-09-16 ENCOUNTER — Encounter: Payer: Self-pay | Admitting: Family Medicine

## 2018-09-22 ENCOUNTER — Encounter (HOSPITAL_COMMUNITY): Payer: Medicare Other

## 2018-10-06 ENCOUNTER — Encounter (HOSPITAL_COMMUNITY): Payer: Medicare Other

## 2019-03-30 DIAGNOSIS — Z23 Encounter for immunization: Secondary | ICD-10-CM | POA: Diagnosis not present

## 2019-04-06 DIAGNOSIS — L738 Other specified follicular disorders: Secondary | ICD-10-CM | POA: Diagnosis not present

## 2019-04-06 DIAGNOSIS — D1801 Hemangioma of skin and subcutaneous tissue: Secondary | ICD-10-CM | POA: Diagnosis not present

## 2019-04-06 DIAGNOSIS — L819 Disorder of pigmentation, unspecified: Secondary | ICD-10-CM | POA: Diagnosis not present

## 2019-04-06 DIAGNOSIS — Z85820 Personal history of malignant melanoma of skin: Secondary | ICD-10-CM | POA: Diagnosis not present

## 2019-04-06 DIAGNOSIS — L814 Other melanin hyperpigmentation: Secondary | ICD-10-CM | POA: Diagnosis not present

## 2019-04-06 DIAGNOSIS — L821 Other seborrheic keratosis: Secondary | ICD-10-CM | POA: Diagnosis not present

## 2019-04-06 DIAGNOSIS — L308 Other specified dermatitis: Secondary | ICD-10-CM | POA: Diagnosis not present

## 2019-04-06 DIAGNOSIS — L570 Actinic keratosis: Secondary | ICD-10-CM | POA: Diagnosis not present

## 2019-04-06 DIAGNOSIS — D229 Melanocytic nevi, unspecified: Secondary | ICD-10-CM | POA: Diagnosis not present

## 2019-04-10 ENCOUNTER — Encounter: Payer: Self-pay | Admitting: Family Medicine

## 2019-04-13 ENCOUNTER — Ambulatory Visit: Payer: Medicare Other | Admitting: Family Medicine

## 2019-04-18 IMAGING — CT CT MAXILLOFACIAL W/O CM
4 of 10 series · 16 of 47 positions shown, 18 images · non-contrast
Comparison: None.

CLINICAL DATA: Recent fall with facial pain and neck pain, initial
encounter

EXAM:
CT HEAD WITHOUT CONTRAST
CT MAXILLOFACIAL WITHOUT CONTRAST
CT CERVICAL SPINE WITHOUT CONTRAST
TECHNIQUE: Multidetector CT imaging of the head, cervical spine, and
maxillofacial structures were performed using the standard protocol
without intravenous contrast. Multiplanar CT image reconstructions
of the cervical spine and maxillofacial structures were also
generated.

[Series 4: coronal soft tissue · coronal · 0.33mm/px · 2 of 72 slices shown]
[im 24/72  bone]
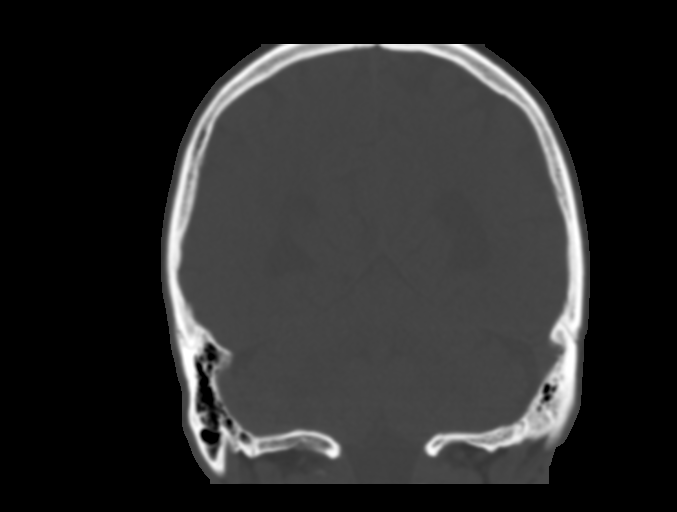
[im 48/72  bone]
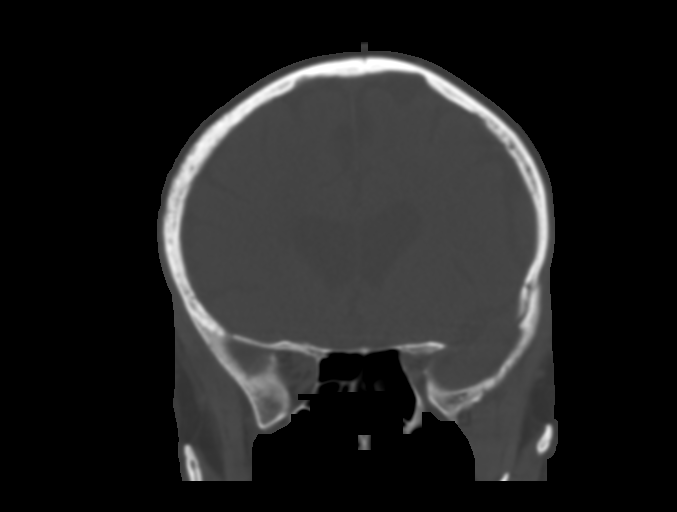

[Series 8: c spine soft · axial · 0.28mm/px · z∈[-164,-76]mm · 5 of 90 slices shown]
[im 12/90  brain]
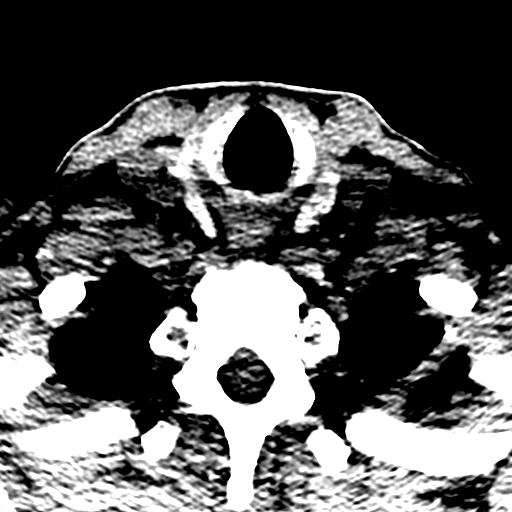
[im 23/90  brain]
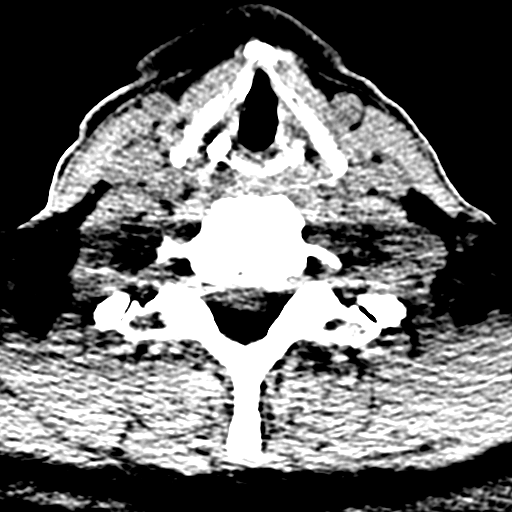
[im 34/90  brain]
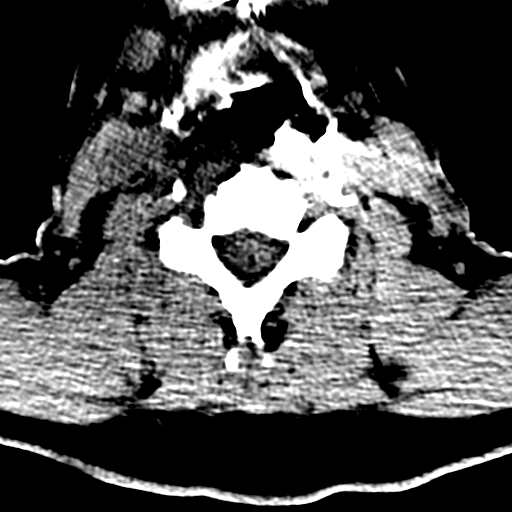
[im 45/90  brain]
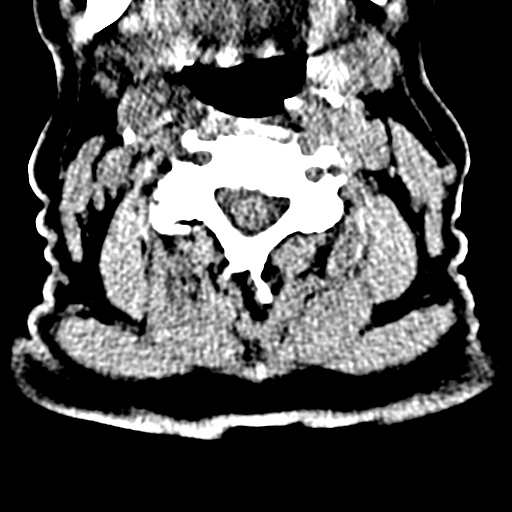
[im 56/90  brain]
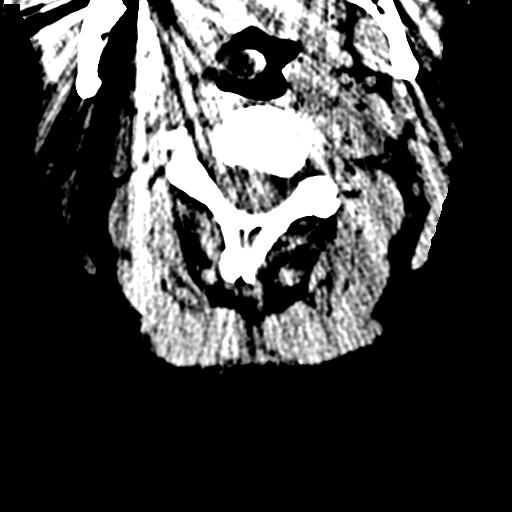

[Series 9: orthogonal axials · axial · 0.23mm/px · z∈[-204,-67]mm · 8 of 103 slices shown, 10 images]
[im 12/103  brain]
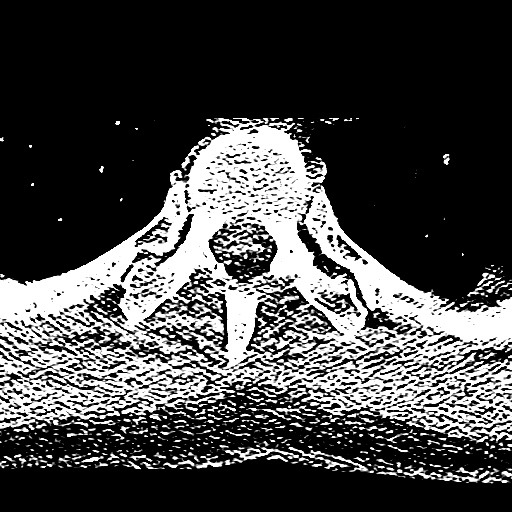
[im 12/103  bone]
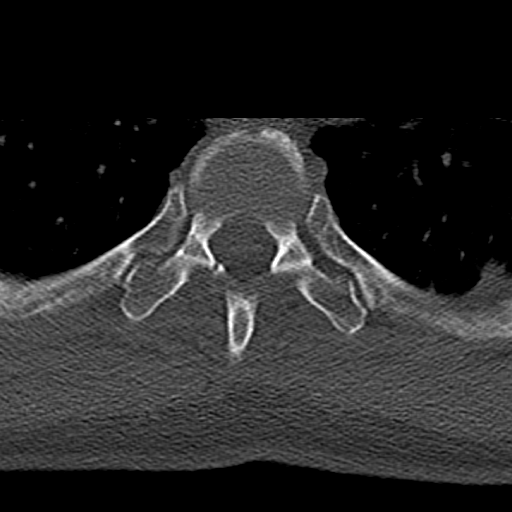
[im 23/103  bone]
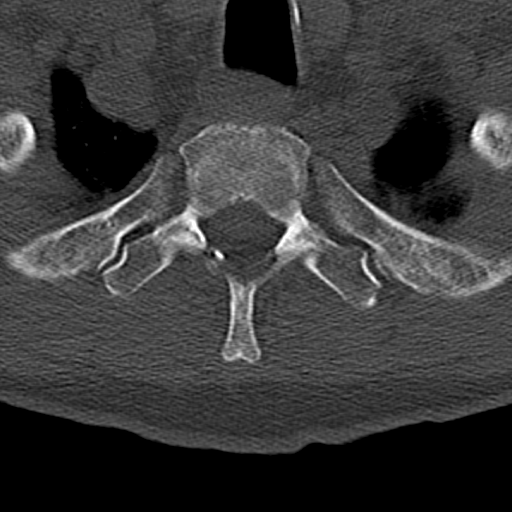
[im 35/103  bone]
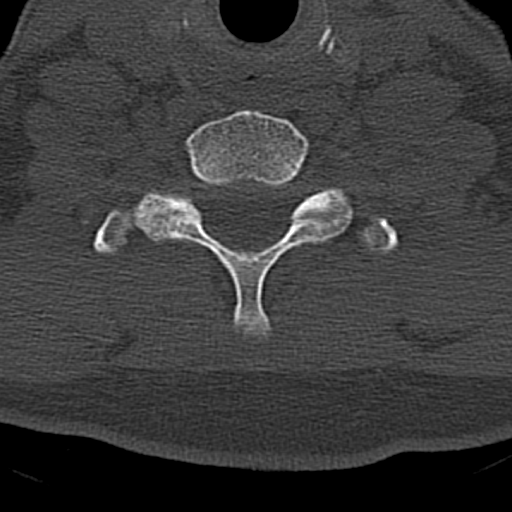
[im 46/103  bone]
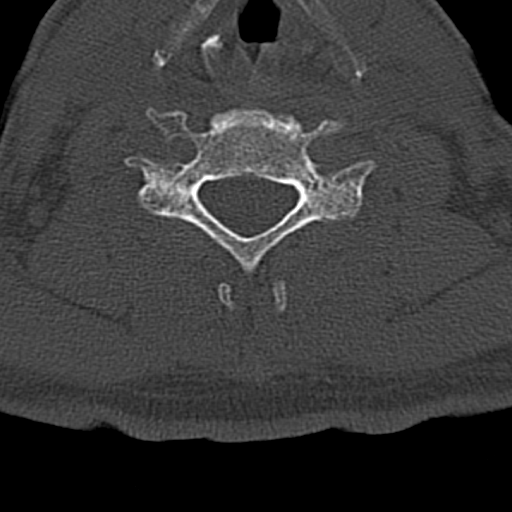
[im 57/103  brain]
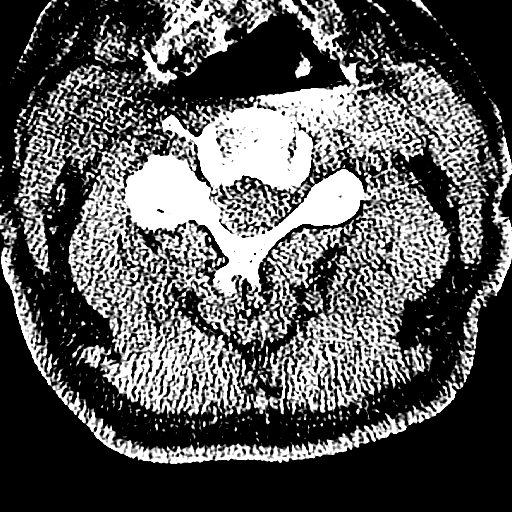
[im 57/103  bone]
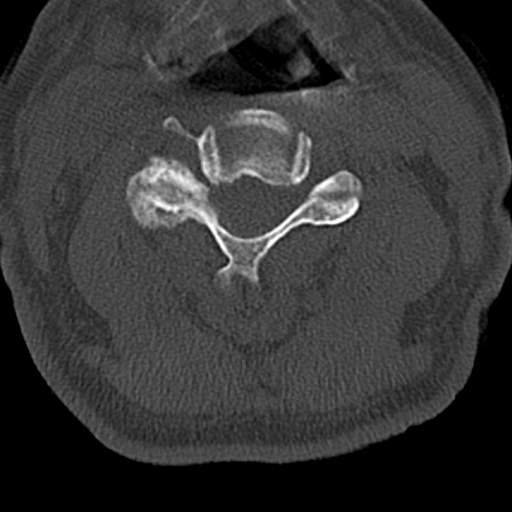
[im 69/103  bone]
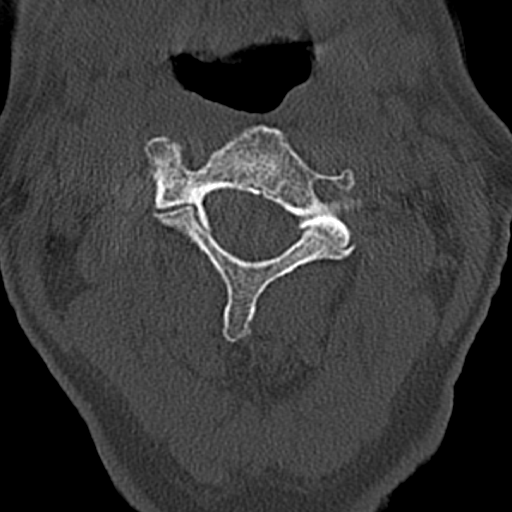
[im 80/103  bone]
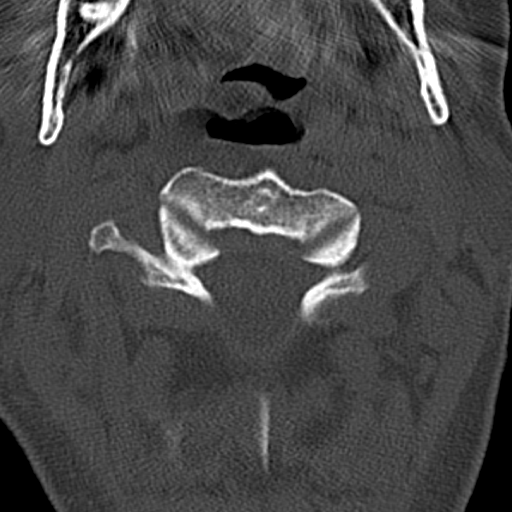
[im 91/103  bone]
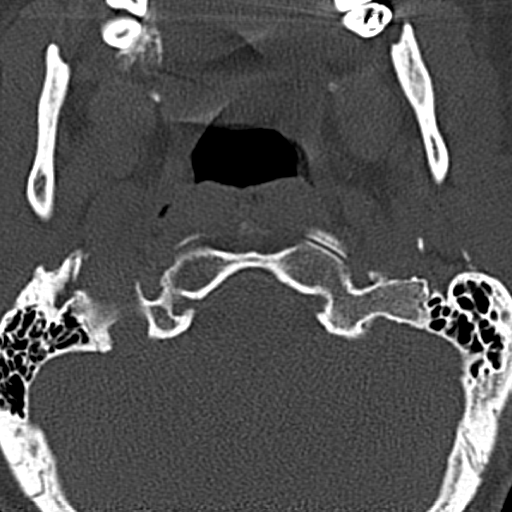

[Series 18: sagittal soft · sagittal · 0.36mm/px · 1 of 76 slices shown]
[im 38/76  bone]
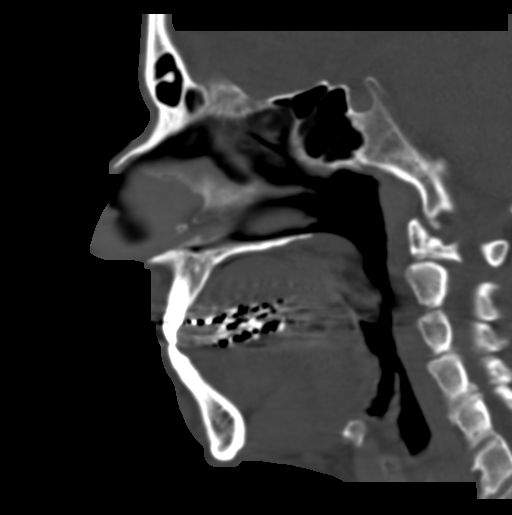

[16 of 47 positions shown; findings below may reference images not displayed]

FINDINGS: CT HEAD FINDINGS

Brain: No evidence of acute infarction, hemorrhage, hydrocephalus,
extra-axial collection or mass lesion/mass effect.

Vascular: No hyperdense vessel or unexpected calcification.

Skull: Normal. Negative for fracture or focal lesion.

Other: None.

CT MAXILLOFACIAL FINDINGS

Osseous: No fracture or mandibular dislocation. No destructive
process.

Orbits: Negative. No traumatic or inflammatory finding.

Sinuses: Clear.

Soft tissues: Negative.

CT CERVICAL SPINE FINDINGS

Alignment: Normal.

Skull base and vertebrae: 7 cervical segments are well visualized.
Vertebral body height is well maintained. Mild osteophytic changes
and facet hypertrophic changes are noted. No findings to suggest
acute fracture or acute facet abnormality are seen.

Soft tissues and spinal canal: Within normal limits.

Upper chest: Within normal limits.

Other: None
IMPRESSION: CT of the head: No acute intracranial abnormality noted.

CT the maxillofacial bones: No acute abnormality noted.

CT of the cervical spine: Multilevel degenerative change without
acute abnormality.

## 2019-04-19 ENCOUNTER — Telehealth: Payer: Self-pay | Admitting: Family Medicine

## 2019-04-19 NOTE — Telephone Encounter (Signed)
I left a message asking the patient to call me at [phone] to schedule AWV with Courtney. VDM (Dee-Dee) °

## 2019-04-20 ENCOUNTER — Encounter (HOSPITAL_COMMUNITY): Payer: Medicare Other

## 2019-05-10 DIAGNOSIS — H5213 Myopia, bilateral: Secondary | ICD-10-CM | POA: Diagnosis not present

## 2019-05-10 DIAGNOSIS — H52203 Unspecified astigmatism, bilateral: Secondary | ICD-10-CM | POA: Diagnosis not present

## 2019-05-10 DIAGNOSIS — H2513 Age-related nuclear cataract, bilateral: Secondary | ICD-10-CM | POA: Diagnosis not present

## 2019-05-10 DIAGNOSIS — H43813 Vitreous degeneration, bilateral: Secondary | ICD-10-CM | POA: Diagnosis not present

## 2019-05-23 ENCOUNTER — Ambulatory Visit: Payer: Medicare Other | Admitting: Family Medicine

## 2019-05-30 ENCOUNTER — Encounter (HOSPITAL_COMMUNITY): Payer: Medicare Other

## 2019-06-06 ENCOUNTER — Telehealth: Payer: Self-pay | Admitting: Family Medicine

## 2019-06-06 NOTE — Telephone Encounter (Signed)
I left a message asking the patient to call and schedule Medicare AWV with Loma Sousa ([place]) on 06/10/2019 after seeing Dr. Yong Channel.  Im waiting for a call back to either confirm or decline the appointment. If patient calls back, please update appointment notes.  VDM (Dee-Dee)

## 2019-06-09 ENCOUNTER — Encounter: Payer: Medicare Other | Admitting: Family Medicine

## 2019-06-10 ENCOUNTER — Encounter: Payer: Self-pay | Admitting: Family Medicine

## 2019-06-10 ENCOUNTER — Other Ambulatory Visit: Payer: Self-pay

## 2019-06-10 ENCOUNTER — Ambulatory Visit (INDEPENDENT_AMBULATORY_CARE_PROVIDER_SITE_OTHER): Payer: Medicare Other

## 2019-06-10 ENCOUNTER — Ambulatory Visit (INDEPENDENT_AMBULATORY_CARE_PROVIDER_SITE_OTHER): Payer: Medicare Other | Admitting: Family Medicine

## 2019-06-10 VITALS — BP 150/82 | Temp 98.1°F | Ht 65.5 in | Wt 151.0 lb

## 2019-06-10 VITALS — BP 150/82 | HR 77 | Temp 98.1°F | Ht 65.5 in | Wt 150.4 lb

## 2019-06-10 DIAGNOSIS — E785 Hyperlipidemia, unspecified: Secondary | ICD-10-CM

## 2019-06-10 DIAGNOSIS — Z0000 Encounter for general adult medical examination without abnormal findings: Secondary | ICD-10-CM

## 2019-06-10 DIAGNOSIS — I10 Essential (primary) hypertension: Secondary | ICD-10-CM

## 2019-06-10 MED ORDER — AMLODIPINE BESYLATE 5 MG PO TABS: 5.0000 mg | ORAL_TABLET | Freq: Every day | ORAL | 3 refills | Status: DC

## 2019-06-10 NOTE — Progress Notes (Signed)
Phone [phone] In person visit   Subjective:   Thomas Craig is a 73 y.o. year old very pleasant male patient who presents for/with See problem oriented charting Chief Complaint  Patient presents with  . Follow-up    fasting  . Hyperlipidemia  . Hypertension   ROS- No chest pain or shortness of breath. No headache or blurry vision.  Some anxiety with covid 19  Past Medical History-  Patient Active Problem List   Diagnosis Date Noted  . History of melanoma 09/05/2014    Priority: High  . History of adenomatous polyp of colon 02/23/2018    Priority: Medium  . BPH (benign prostatic hyperplasia) 09/05/2014    Priority: Medium  . Essential hypertension 09/05/2014    Priority: Medium  . Hyperlipidemia 09/05/2014    Priority: Medium  . Hematuria 06/19/2016    Priority: Low  . Bunion of great toe of left foot 10/20/2011    Priority: Low  . Acute right ankle pain 09/29/2011    Priority: Low    Medications- reviewed and updated Current Outpatient Medications  Medication Sig Dispense Refill  . amLODipine (NORVASC) 5 MG tablet Take 1 tablet (5 mg total) by mouth daily. 90 tablet 3  . Ascorbic Acid (VITAMIN C) 1000 MG tablet Take 1,000 mg by mouth daily. 1-3 qd     . aspirin 325 MG tablet Take 325 mg by mouth daily.      . Calcium Carbonate-Vitamin D (CALCIUM-VITAMIN D) 500-200 MG-UNIT per tablet Take 1 tablet by mouth daily.      . cyanocobalamin 500 MCG tablet Take 500 mcg by mouth daily.    Marland Kitchen doxylamine, Sleep, (UNISOM) 25 MG tablet Take 25 mg by mouth at bedtime as needed.    . Glucosamine-Chondroit-Vit C-Mn (GLUCOSAMINE CHONDR 1500 COMPLX) CAPS Take 2 capsules by mouth daily.     . Multiple Vitamin (MULTIVITAMIN) tablet Take 1 tablet by mouth daily.      . multivitamin-lutein (OCUVITE-LUTEIN) CAPS Take 1 capsule by mouth daily.    . OMEGA-3 1000 MG CAPS Take 4 capsules by mouth daily.      . Saw Palmetto, Serenoa repens, 1000 MG CAPS Take by mouth 1 day or 1 dose.       No current facility-administered medications for this visit.      Objective:  BP (!) 150/82   Pulse 77   Temp 98.1 F (36.7 C)   Ht 5' 5.5" (1.664 m)   Wt 150 lb 6.4 oz (68.2 kg)   SpO2 99%   BMI 24.65 kg/m  Gen: NAD, resting comfortably CV: RRR no murmurs rubs or gallops Lungs: CTAB no crackles, wheeze, rhonchi Ext: no edema Skin: warm, dry Neuro: Normal gait and speech    Assessment and Plan   #Health maintenance-reviewed after visit and can discuss Shingrix next visit  #hyperlipidemia S: Untreated.  10-year ASCVD risk close to 20% - 19% when last checked.  With elevated blood pressure today risk at 26.5%  Last visit we discussed possibly doing coronary CT if this will take the balance for him using statin or trying once a week statin.Patient continues to eat a healthy diet and exercise regularly and has normal BMI  He is trying to eat more plant-based diet-often even doing vegetarian or vegan options Lab Results  Component Value Date   CHOL 209 (H) 09/15/2018   HDL 75.50 09/15/2018   LDLCALC 121 (H) 09/15/2018   LDLDIRECT 100.5 10/27/2011   TRIG 63.0 09/15/2018   CHOLHDL  3 09/15/2018   A/P: Suspect poorly controlled still-he does not want to proceed forward with weekly statin normal coronary CT at this time.  He would like to discuss again at his physical in February after we get lab results back  #hypertension S: compliant with amlodipine 5 mg.  Home blood pressure numbers 130/77 over last 100 readings.  Occasionally higher but comes down on rechecks. Has had #s as low as 110s as well.   Tries to eat low-sodium diet  Admits to some higher stress with pandemic. Has seen some folks without masks, doesn't like signing things- uses gloves for this  Ran 5 miles this morning. Last marathon 3 years ago.  BP Readings from Last 3 Encounters:  06/10/19 (!) 150/82  06/10/19 (!) 150/82  09/07/18 140/80  A/P:  slight elevation last visit and more elevated today but  home #s still look good- we opted to try amlodipine before bed instead of in the morning and also increase dose to 10 mg-he wants to be aggressive about reducing cardiovascular risk particularly with wanting to remain off statin if possible. -Asked him to update me in 2 to 3 weeks with blood pressures -Before patient and I decided to increase dose to 10 mg we had already sent in amlodipine 5 mg-he does not want me to send in 10 mg tablets yet but wants to see how he does with this adjustment first  Recommended follow up: We are already scheduled for yearly visit/not physical due to  having traditional Medicare in February Future Appointments  Date Time Provider [place]  09/15/2019  1:20 PM Marin Olp, MD [facility]   Lab/Order associations:   ICD-10-CM   1. Essential hypertension  I10   2. Hyperlipidemia, unspecified hyperlipidemia type  E78.5     Meds ordered this encounter  Medications  . amLODipine (NORVASC) 5 MG tablet    Sig: Take 1 tablet (5 mg total) by mouth daily.    Dispense:  90 tablet    Refill:  3   Return precautions advised.  Garret Reddish, MD

## 2019-06-10 NOTE — Patient Instructions (Addendum)
We opted to increase amlodipine to 10mg  (take 2 of the 5 mg tablets for now)   Update me with daily blood pressures after 2-3 weeks and definitely let me know if you have side effects like feeling lightheaded, blood pressure getting low such as <100/55, or significant ankle swelling.   Otherwise see each other in february

## 2019-06-10 NOTE — Progress Notes (Signed)
Subjective:   KARIN PISANO is a 73 y.o. male who presents for Medicare Annual/Subsequent preventive examination.  Review of Systems:   Cardiac Risk Factors include: advanced age (>58men, >86 women);male gender;hypertension;dyslipidemia    Objective:    Vitals: BP (!) 150/82   Temp 98.1 F (36.7 C)   Ht 5' 5.5" (1.664 m)   Wt 151 lb 0.2 oz (68.5 kg)   BMI 24.75 kg/m   Body mass index is 24.75 kg/m.  Advanced Directives 06/10/2019 12/18/2016  Does Patient Have a Medical Advance Directive? Yes Yes  Type of Paramedic of [place];Living will -  Does patient want to make changes to medical advance directive? No - Patient declined -  Copy of [place] in Chart? No - copy requested -    Tobacco Social History   Tobacco Use  Smoking Status Former Smoker  . Packs/day: 0.75  . Years: 5.00  . Pack years: 3.75  . Types: Cigarettes  . Quit date: 08/05/1975  . Years since quitting: 43.8  Smokeless Tobacco Never Used  Tobacco Comment   ONLY SMOKED FOR 5 Y EARS or less with limited cigarettes     Counseling given: Not Answered Comment: ONLY SMOKED FOR 5 Y EARS or less with limited cigarettes   Clinical Intake:  Pre-visit preparation completed: Yes  Diabetes: No  How often do you need to have someone help you when you read instructions, pamphlets, or other written materials from your doctor or pharmacy?: 1 - Never  Interpreter Needed?: No  Information entered by :: Denman George LPN  Past Medical History:  Diagnosis Date  . BPH (benign prostatic hyperplasia) 09/05/2014   Saw palmetto has improved nocturia 3-5x a night to 1-2x a night.    . Bunion of great toe of left foot 10/20/2011   Hallux valgus shift on left with some early changes starting on RT   . Hypertension   . Melanoma of skin ([place]) 09/05/2014   Dr. Allyson Sabal. Every 6 month follow up.     Past Surgical History:  Procedure Laterality Date  . COLONOSCOPY  10/2007    [facility] - normal  . MELANOMA EXCISION     right arm  . TOOTH EXTRACTION     only 1 wisdom tooth ext   Family History  Problem Relation Age of Onset  . Alzheimer's disease Mother        lived to 19  . Melanoma Father   . Alzheimer's disease Father        lived to 28  . Anxiety disorder Sister        agoraphobia  . Colon cancer Neg Hx   . Rectal cancer Neg Hx   . Stomach cancer Neg Hx    Social History   Socioeconomic History  . Marital status: Married    Spouse name: Not on file  . Number of children: Not on file  . Years of education: Not on file  . Highest education level: Not on file  Occupational History  . Not on file  Social Needs  . Financial resource strain: Not on file  . Food insecurity    Worry: Not on file    Inability: Not on file  . Transportation needs    Medical: Not on file    Non-medical: Not on file  Tobacco Use  . Smoking status: Former Smoker    Packs/day: 0.75    Years: 5.00    Pack years: 3.75  Types: Cigarettes    Quit date: 08/05/1975    Years since quitting: 43.8  . Smokeless tobacco: Never Used  . Tobacco comment: ONLY SMOKED FOR 5 Y EARS or less with limited cigarettes  Substance and Sexual Activity  . Alcohol use: Yes    Alcohol/week: 10.0 - 14.0 standard drinks    Types: 10 - 14 Standard drinks or equivalent per week    Comment: Beer/wine  . Drug use: No  . Sexual activity: Yes  Lifestyle  . Physical activity    Days per week: Not on file    Minutes per session: Not on file  . Stress: Not on file  Relationships  . Social Herbalist on phone: Not on file    Gets together: Not on file    Attends religious service: Not on file    Active member of club or organization: Not on file    Attends meetings of clubs or organizations: Not on file    Relationship status: Not on file  Other Topics Concern  . Not on file  Social History Narrative   Married 1975 (Wife with another Conservation officer, historic buildings). 3 daughters (38, 36,34  in 2016). 2 granddaughters (only youngest married)      Retired from [employer] for [employer]	[place]. Ran tax group.    Doing some consulting. Increase exercise. Time with dogs. Started back with rotary club). Traveling more.  Marathon October 2017 in [place].       Hobbies: running (huge passion runs marathons), biking, gardening, occasional golf    Outpatient Encounter Medications as of 06/10/2019  Medication Sig  . amLODipine (NORVASC) 5 MG tablet Take 1 tablet (5 mg total) by mouth daily.  . Ascorbic Acid (VITAMIN C) 1000 MG tablet Take 1,000 mg by mouth daily. 1-3 qd   . aspirin 325 MG tablet Take 325 mg by mouth daily.    . Calcium Carbonate-Vitamin D (CALCIUM-VITAMIN D) 500-200 MG-UNIT per tablet Take 1 tablet by mouth daily.    . cyanocobalamin 500 MCG tablet Take 500 mcg by mouth daily.  . Glucosamine-Chondroit-Vit C-Mn (GLUCOSAMINE CHONDR 1500 COMPLX) CAPS Take 2 capsules by mouth daily.   . Multiple Vitamin (MULTIVITAMIN) tablet Take 1 tablet by mouth daily.    . multivitamin-lutein (OCUVITE-LUTEIN) CAPS Take 1 capsule by mouth daily.  . OMEGA-3 1000 MG CAPS Take 4 capsules by mouth daily.    . Saw Palmetto, Serenoa repens, 1000 MG CAPS Take by mouth 1 day or 1 dose.   . [DISCONTINUED] 0.9 %  sodium chloride infusion    No facility-administered encounter medications on file as of 06/10/2019.     Activities of Daily Living In your present state of health, do you have any difficulty performing the following activities: 06/10/2019  Hearing? N  Vision? N  Difficulty concentrating or making decisions? N  Walking or climbing stairs? N  Dressing or bathing? N  Doing errands, shopping? N  Preparing Food and eating ? N  Using the Toilet? N  In the past six months, have you accidently leaked urine? N  Do you have problems with loss of bowel control? N  Managing your Medications? N  Managing your Finances? N  Housekeeping or managing your Housekeeping? N  Some recent data  might be hidden    Patient Care Team: Marin Olp, MD as PCP - General (Family Medicine)   Assessment:   This is a routine wellness examination for [place].  Exercise Activities and Dietary recommendations  Current Exercise Habits: Home exercise routine, Type of exercise: walking;Other - see comments;yoga(running), Time (Minutes): 45, Frequency (Times/Week): 7, Weekly Exercise (Minutes/Week): 315, Intensity: Moderate  Goals    . Blood Pressure < 120/70     Will monitor sodium 2500 mg or 1 tsp of sodium daily  Or you can drink lemon water (hot) in the am x 3 if you have had a lot of sodium          Fall Risk Fall Risk  06/10/2019 09/07/2018 06/24/2018 03/04/2018 12/18/2016  Falls in the past year? 0 0 0 No Yes  Comment - - Emmi Telephone Survey: data to providers prior to load Emmi Telephone Survey: data to providers prior to load running a trail   Number falls in past yr: 0 0 - - 1  Injury with Fall? 0 0 - - -  Follow up - - - - Education provided   Is the patient's home free of loose throw rugs in walkways, pet beds, electrical cords, etc?   yes      Grab bars in the bathroom? yes      Handrails on the stairs?   yes      Adequate lighting?   yes  Timed Get Up and Go Performed: completed and within normal timeframe; no gait abnormalities noted   Depression Screen PHQ 2/9 Scores 06/10/2019 09/07/2018 12/18/2016 06/19/2016  PHQ - 2 Score 0 0 0 0  PHQ- 9 Score 0 0 - -    Cognitive Function- no cognitive concerns at this time  MMSE - Mini Mental State Exam 12/18/2016  Not completed: (No Data)     6CIT Screen 06/10/2019  What Year? 0 points  What month? 0 points  What time? 0 points  Count back from 20 0 points  Months in reverse 0 points  Repeat phrase 0 points  Total Score 0    Immunization History  Administered Date(s) Administered  . Influenza Whole 07/04/2009  . Influenza, High Dose Seasonal PF 03/30/2019  . Influenza,inj,Quad PF,6+ Mos 04/20/2013, 03/19/2016,  05/25/2017  . Influenza-Unspecified 05/16/2014, 05/16/2015, 06/18/2018  . Pneumococcal Conjugate-13 09/05/2014  . Pneumococcal Polysaccharide-23 10/18/2012  . Td 08/04/2005  . Tdap 09/20/2015  . Zoster 10/18/2012    Qualifies for Shingles Vaccine? Discussed and patient will check with pharmacy for coverage.  Patient education handout provided   Screening Tests Health Maintenance  Topic Date Due  . COLONOSCOPY  02/10/2021  . TETANUS/TDAP  09/19/2025  . INFLUENZA VACCINE  Completed  . Hepatitis C Screening  Completed  . PNA vac Low Risk Adult  Completed   Cancer Screenings: Lung: Low Dose CT Chest recommended if Age 17-80 years, 30 pack-year currently smoking OR have quit w/in 15years. Patient does not qualify. Colorectal: colonoscopy 02/10/18 with Dr. Havery [name]       Plan:  I have personally reviewed and addressed the Medicare Annual Wellness questionnaire and have noted the following in the patient's chart:  A. Medical and social history B. Use of alcohol, tobacco or illicit drugs  C. Current medications and supplements D. Functional ability and status E.  Nutritional status F.  Physical activity G. Advance directives H. List of other physicians I.  Hospitalizations, surgeries, and ER visits in previous 12 months J.  [place] such as hearing and vision if needed, cognitive and depression L. Referrals, records requested, and appointments- none   In addition, I have reviewed and discussed with patient certain preventive protocols, quality metrics, and best practice recommendations. A  written personalized care plan for preventive services as well as general preventive health recommendations were provided to patient.   Signed,  Denman George, LPN  Nurse Health Advisor   Nurse Notes: no additional

## 2019-06-10 NOTE — Patient Instructions (Signed)
Thomas Craig , Thank you for taking time to come for your Medicare Wellness Visit. I appreciate your ongoing commitment to your health goals. Please review the following plan we discussed and let me know if I can assist you in the future.   Screening recommendations/referrals: Colorectal Screening: up to date; colonoscopy 02/10/18 with Dr. Havery [name]  Vision and Dental Exams: Recommended annual ophthalmology exams for early detection of glaucoma and other disorders of the eye Recommended annual dental exams for proper oral hygiene  Vaccinations: Influenza vaccine: completed 03/30/19 Pneumococcal vaccine: up to date; last 09/05/14 Tdap vaccine: up to date; last 09/20/15  Shingles vaccine: Please call your insurance company to determine your out of pocket expense for the Shingrix vaccine. You may receive this vaccine at your local pharmacy.  Advanced directives: Please bring a copy of your POA (Power of Attorney) and/or Living Will to your next appointment.  Goals: Recommend to drink at least 6-8 8oz glasses of water per day and consume a balanced diet rich in fresh fruits and vegetables.   Next appointment: Please schedule your Annual Wellness Visit with your Nurse Health Advisor in one year.  Preventive Care 20 Years and Older, Male Preventive care refers to lifestyle choices and visits with your health care provider that can promote health and wellness. What does preventive care include?  A yearly physical exam. This is also called an annual well check.  Dental exams once or twice a year.  Routine eye exams. Ask your health care provider how often you should have your eyes checked.  Personal lifestyle choices, including:  Daily care of your teeth and gums.  Regular physical activity.  Eating a healthy diet.  Avoiding tobacco and drug use.  Limiting alcohol use.  Practicing safe sex.  Taking low doses of aspirin every day if recommended by your health care provider..   Taking vitamin and mineral supplements as recommended by your health care provider. What happens during an annual well check? The services and screenings done by your health care provider during your annual well check will depend on your age, overall health, lifestyle risk factors, and family history of disease. Counseling  Your health care provider may ask you questions about your:  Alcohol use.  Tobacco use.  Drug use.  Emotional well-being.  Home and relationship well-being.  Sexual activity.  Eating habits.  History of falls.  Memory and ability to understand (cognition).  Work and work Statistician. Screening  You may have the following tests or measurements:  Height, weight, and BMI.  Blood pressure.  Lipid and cholesterol levels. These may be checked every 5 years, or more frequently if you are over 11 years old.  Skin check.  Lung cancer screening. You may have this screening every year starting at age 18 if you have a 30-pack-year history of smoking and currently smoke or have quit within the past 15 years.  Fecal occult blood test (FOBT) of the stool. You may have this test every year starting at age 55.  Flexible sigmoidoscopy or colonoscopy. You may have a sigmoidoscopy every 5 years or a colonoscopy every 10 years starting at age 19.  Prostate cancer screening. Recommendations will vary depending on your family history and other risks.  Hepatitis C blood test.  Hepatitis B blood test.  Sexually transmitted disease (STD) testing.  Diabetes screening. This is done by checking your blood sugar (glucose) after you have not eaten for a while (fasting). You may have this done every 1-3 years.  Abdominal aortic aneurysm (AAA) screening. You may need this if you are a current or former smoker.  Osteoporosis. You may be screened starting at age 91 if you are at high risk. Talk with your health care provider about your test results, treatment options, and if  necessary, the need for more tests. Vaccines  Your health care provider may recommend certain vaccines, such as:  Influenza vaccine. This is recommended every year.  Tetanus, diphtheria, and acellular pertussis (Tdap, Td) vaccine. You may need a Td booster every 10 years.  Zoster vaccine. You may need this after age 38.  Pneumococcal 13-valent conjugate (PCV13) vaccine. One dose is recommended after age 54.  Pneumococcal polysaccharide (PPSV23) vaccine. One dose is recommended after age 32. Talk to your health care provider about which screenings and vaccines you need and how often you need them. This information is not intended to replace advice given to you by your health care provider. Make sure you discuss any questions you have with your health care provider. Document Released: 08/17/2015 Document Revised: 04/09/2016 Document Reviewed: 05/22/2015 Elsevier Interactive Patient Education  2017 [place] Prevention in the Home Falls can cause injuries. They can happen to people of all ages. There are many things you can do to make your home safe and to help prevent falls. What can I do on the outside of my home?  Regularly fix the edges of walkways and driveways and fix any cracks.  Remove anything that might make you trip as you walk through a door, such as a raised step or threshold.  Trim any bushes or trees on the path to your home.  Use bright outdoor lighting.  Clear any walking paths of anything that might make someone trip, such as rocks or tools.  Regularly check to see if handrails are loose or broken. Make sure that both sides of any steps have handrails.  Any raised decks and porches should have guardrails on the edges.  Have any leaves, snow, or ice cleared regularly.  Use sand or salt on walking paths during winter.  Clean up any spills in your garage right away. This includes oil or grease spills. What can I do in the bathroom?  Use night lights.   Install grab bars by the toilet and in the tub and shower. Do not use towel bars as grab bars.  Use non-skid mats or decals in the tub or shower.  If you need to sit down in the shower, use a plastic, non-slip stool.  Keep the floor dry. Clean up any water that spills on the floor as soon as it happens.  Remove soap buildup in the tub or shower regularly.  Attach bath mats securely with double-sided non-slip rug tape.  Do not have throw rugs and other things on the floor that can make you trip. What can I do in the bedroom?  Use night lights.  Make sure that you have a light by your bed that is easy to reach.  Do not use any sheets or blankets that are too big for your bed. They should not hang down onto the floor.  Have a firm chair that has side arms. You can use this for support while you get dressed.  Do not have throw rugs and other things on the floor that can make you trip. What can I do in the kitchen?  Clean up any spills right away.  Avoid walking on wet floors.  Keep items that you use  a lot in easy-to-reach places.  If you need to reach something above you, use a strong step stool that has a grab bar.  Keep electrical cords out of the way.  Do not use floor polish or wax that makes floors slippery. If you must use wax, use non-skid floor wax.  Do not have throw rugs and other things on the floor that can make you trip. What can I do with my stairs?  Do not leave any items on the stairs.  Make sure that there are handrails on both sides of the stairs and use them. Fix handrails that are broken or loose. Make sure that handrails are as long as the stairways.  Check any carpeting to make sure that it is firmly attached to the stairs. Fix any carpet that is loose or worn.  Avoid having throw rugs at the top or bottom of the stairs. If you do have throw rugs, attach them to the floor with carpet tape.  Make sure that you have a light switch at the top of the  stairs and the bottom of the stairs. If you do not have them, ask someone to add them for you. What else can I do to help prevent falls?  Wear shoes that:  Do not have high heels.  Have rubber bottoms.  Are comfortable and fit you well.  Are closed at the toe. Do not wear sandals.  If you use a stepladder:  Make sure that it is fully opened. Do not climb a closed stepladder.  Make sure that both sides of the stepladder are locked into place.  Ask someone to hold it for you, if possible.  Clearly mark and make sure that you can see:  Any grab bars or handrails.  First and last steps.  Where the edge of each step is.  Use tools that help you move around (mobility aids) if they are needed. These include:  Canes.  Walkers.  Scooters.  Crutches.  Turn on the lights when you go into a dark area. Replace any light bulbs as soon as they burn out.  Set up your furniture so you have a clear path. Avoid moving your furniture around.  If any of your floors are uneven, fix them.  If there are any pets around you, be aware of where they are.  Review your medicines with your doctor. Some medicines can make you feel dizzy. This can increase your chance of falling. Ask your doctor what other things that you can do to help prevent falls. This information is not intended to replace advice given to you by your health care provider. Make sure you discuss any questions you have with your health care provider. Document Released: 05/17/2009 Document Revised: 12/27/2015 Document Reviewed: 08/25/2014 Elsevier Interactive Patient Education  2017 [employer].

## 2019-06-11 NOTE — Progress Notes (Signed)
I have reviewed and agree with note, evaluation, plan.   Stephen Hunter, MD  

## 2019-06-15 ENCOUNTER — Encounter (HOSPITAL_COMMUNITY): Payer: Medicare Other

## 2019-08-09 ENCOUNTER — Encounter: Payer: Self-pay | Admitting: Family Medicine

## 2019-08-10 ENCOUNTER — Other Ambulatory Visit: Payer: Self-pay

## 2019-08-10 MED ORDER — AMLODIPINE BESYLATE 10 MG PO TABS: 10.0000 mg | ORAL_TABLET | Freq: Every day | ORAL | 1 refills | Status: DC

## 2019-08-19 ENCOUNTER — Encounter: Payer: Self-pay | Admitting: Family Medicine

## 2019-08-24 ENCOUNTER — Ambulatory Visit: Payer: Medicare Other | Attending: Internal Medicine

## 2019-08-24 DIAGNOSIS — Z23 Encounter for immunization: Secondary | ICD-10-CM | POA: Diagnosis not present

## 2019-08-24 NOTE — Progress Notes (Signed)
   Covid-19 Vaccination Clinic  Name:  MONTERRIO GAUGH    MRN: [id number] DOB: 12-16-1945  08/24/2019  Mr. Dubs was observed post Covid-19 immunization for 15 minutes without incidence. He was provided with Vaccine Information Sheet and instruction to access the V-Safe system.   Mr. Bassinger was instructed to call 911 with any severe reactions post vaccine: Marland Kitchen Difficulty breathing  . Swelling of your face and throat  . A fast heartbeat  . A bad rash all over your body  . Dizziness and weakness    Immunizations Administered    Name Date Dose VIS Date Route   Pfizer COVID-19 Vaccine 08/24/2019 11:14 AM 0.3 mL 07/15/2019 Intramuscular   Manufacturer: [place]   Lot: [id number]   [place]: [id number]

## 2019-09-14 ENCOUNTER — Ambulatory Visit: Payer: Medicare Other | Attending: Internal Medicine

## 2019-09-14 ENCOUNTER — Other Ambulatory Visit: Payer: Self-pay

## 2019-09-14 ENCOUNTER — Encounter: Payer: Self-pay | Admitting: Family Medicine

## 2019-09-14 DIAGNOSIS — Z23 Encounter for immunization: Secondary | ICD-10-CM

## 2019-09-14 NOTE — Progress Notes (Signed)
   Covid-19 Vaccination Clinic  Name:  Thomas Craig    MRN: [id number] DOB: 31-Oct-1945  09/14/2019  [name] was observed post Covid-19 immunization for 15 minutes without incidence. He was provided with Vaccine Information Sheet and instruction to access the V-Safe system.   [name] was instructed to call 911 with any severe reactions post vaccine: Marland Kitchen Difficulty breathing  . Swelling of your face and throat  . A fast heartbeat  . A bad rash all over your body  . Dizziness and weakness    Immunizations Administered    Name Date Dose VIS Date Route   Pfizer COVID-19 Vaccine 09/14/2019  3:25 PM 0.3 mL 07/15/2019 Intramuscular   Manufacturer: [place]   Lot: [id number]   [place]: [id number]

## 2019-09-15 ENCOUNTER — Ambulatory Visit (INDEPENDENT_AMBULATORY_CARE_PROVIDER_SITE_OTHER): Payer: Medicare Other | Admitting: Family Medicine

## 2019-09-15 ENCOUNTER — Encounter: Payer: Self-pay | Admitting: Family Medicine

## 2019-09-15 VITALS — BP 138/82 | HR 89 | Temp 97.3°F | Ht 65.5 in | Wt 151.4 lb

## 2019-09-15 DIAGNOSIS — E785 Hyperlipidemia, unspecified: Secondary | ICD-10-CM

## 2019-09-15 DIAGNOSIS — N401 Enlarged prostate with lower urinary tract symptoms: Secondary | ICD-10-CM | POA: Diagnosis not present

## 2019-09-15 DIAGNOSIS — R351 Nocturia: Secondary | ICD-10-CM

## 2019-09-15 DIAGNOSIS — I10 Essential (primary) hypertension: Secondary | ICD-10-CM | POA: Diagnosis not present

## 2019-09-15 LAB — LIPID PANEL
Cholesterol: 221 mg/dL — ABNORMAL HIGH (ref 0–200)
HDL: 92 mg/dL (ref >39.00)
LDL Cholesterol: 118 mg/dL — ABNORMAL HIGH (ref 0–99)
NonHDL: 129.31
Total CHOL/HDL Ratio: 2
Triglycerides: 56 mg/dL (ref 0.0–149.0)
VLDL: 11.2 mg/dL (ref 0.0–40.0)

## 2019-09-15 LAB — CBC WITH DIFFERENTIAL/PLATELET
Basophils Absolute: 0 10*3/uL (ref 0.0–0.1)
Basophils Relative: 0.6 % (ref 0.0–3.0)
Eosinophils Absolute: 0 10*3/uL (ref 0.0–0.7)
Eosinophils Relative: 0.6 % (ref 0.0–5.0)
HCT: 40.6 % (ref 39.0–52.0)
Hemoglobin: 14 g/dL (ref 13.0–17.0)
Lymphocytes Relative: 23.6 % (ref 12.0–46.0)
Lymphs Abs: 1.7 10*3/uL (ref 0.7–4.0)
MCHC: 34.5 g/dL (ref 30.0–36.0)
MCV: 93.2 fl (ref 78.0–100.0)
Monocytes Absolute: 0.7 10*3/uL (ref 0.1–1.0)
Monocytes Relative: 8.9 % (ref 3.0–12.0)
Neutro Abs: 4.9 10*3/uL (ref 1.4–7.7)
Neutrophils Relative %: 66.3 % (ref 43.0–77.0)
Platelets: 191 10*3/uL (ref 150.0–400.0)
RBC: 4.36 Mil/uL (ref 4.22–5.81)
RDW: 12.8 % (ref 11.5–15.5)
WBC: 7.4 10*3/uL (ref 4.0–10.5)

## 2019-09-15 LAB — COMPREHENSIVE METABOLIC PANEL
ALT: 22 U/L (ref 0–53)
AST: 27 U/L (ref 0–37)
Albumin: 4.6 g/dL (ref 3.5–5.2)
Alkaline Phosphatase: 33 U/L — ABNORMAL LOW (ref 39–117)
BUN: 20 mg/dL (ref 6–23)
CO2: 32 mEq/L (ref 19–32)
Calcium: 10 mg/dL (ref 8.4–10.5)
Chloride: 96 mEq/L (ref 96–112)
Creatinine, Ser: 0.9 mg/dL (ref 0.40–1.50)
GFR: 82.69 mL/min (ref >60.00)
Glucose, Bld: 97 mg/dL (ref 70–99)
Potassium: 4.6 mEq/L (ref 3.5–5.1)
Sodium: 133 mEq/L — ABNORMAL LOW (ref 135–145)
Total Bilirubin: 0.9 mg/dL (ref 0.2–1.2)
Total Protein: 7 g/dL (ref 6.0–8.3)

## 2019-09-15 LAB — PSA: PSA: 1.9 ng/mL (ref 0.10–4.00)

## 2019-09-15 MED ORDER — AMLODIPINE BESYLATE 5 MG PO TABS: 5.0000 mg | ORAL_TABLET | Freq: Every day | ORAL | 1 refills | Status: DC

## 2019-09-15 NOTE — Progress Notes (Signed)
Phone [phone] In person visit   Subjective:   Thomas Craig is a 74 y.o. year old very pleasant male patient who presents for/with See problem oriented charting Chief Complaint  Patient presents with  . Hypertension  . Hyperlipidemia   This visit occurred during the SARS-CoV-2 public health emergency.  Safety protocols were in place, including screening questions prior to the visit, additional usage of staff PPE, and extensive cleaning of exam room while observing appropriate contact time as indicated for disinfecting solutions.   Past Medical History-  Patient Active Problem List   Diagnosis Date Noted  . History of melanoma 09/05/2014    Priority: High  . History of adenomatous polyp of colon 02/23/2018    Priority: Medium  . BPH (benign prostatic hyperplasia) 09/05/2014    Priority: Medium  . Essential hypertension 09/05/2014    Priority: Medium  . Hyperlipidemia 09/05/2014    Priority: Medium  . History of hematuria 06/19/2016    Priority: Low  . Bunion of great toe of left foot 10/20/2011    Priority: Low    Medications- reviewed and updated Current Outpatient Medications  Medication Sig Dispense Refill  . amLODipine (NORVASC) 5 MG tablet Take 1 tablet (5 mg total) by mouth daily. 90 tablet 1  . Ascorbic Acid (VITAMIN C) 1000 MG tablet Take 1,000 mg by mouth daily. 1-3 qd     . Calcium Carbonate-Vitamin D (CALCIUM-VITAMIN D) 500-200 MG-UNIT per tablet Take 1 tablet by mouth daily.      . cyanocobalamin 500 MCG tablet Take 500 mcg by mouth daily.    Marland Kitchen doxylamine, Sleep, (UNISOM) 25 MG tablet Take 25 mg by mouth at bedtime as needed.    . Glucosamine-Chondroit-Vit C-Mn (GLUCOSAMINE CHONDR 1500 COMPLX) CAPS Take 2 capsules by mouth daily.     . Multiple Vitamin (MULTIVITAMIN) tablet Take 1 tablet by mouth daily.      . multivitamin-lutein (OCUVITE-LUTEIN) CAPS Take 1 capsule by mouth daily.    . OMEGA-3 1000 MG CAPS Take 4 capsules by mouth daily.      . Saw  Palmetto, Serenoa repens, 1000 MG CAPS Take by mouth 1 day or 1 dose.      No current facility-administered medications for this visit.     Objective:  BP 138/82   Pulse 89   Temp (!) 97.3 F (36.3 C) (Temporal)   Ht 5' 5.5" (1.664 m)   Wt 151 lb 6.4 oz (68.7 kg)   SpO2 98%   BMI 24.81 kg/m  Gen: NAD, resting comfortably CV: RRR no murmurs rubs or gallops Lungs: CTAB no crackles, wheeze, rhonchi Ext: no edema Skin: warm, dry    Assessment and Plan   # HM- had pfizer vaccine  # cleaned up allergies- reviewed chart in 2012- allergy was to septra/ sulfa not to doxycycline from 12/2010  # hypertension S:compliant with amlodipine 10 mg.  Has been checking readings at home and after mediation changes in December readings have been on average 128/74 on 10 mg, had been 130/76 prior on 5 mg over a month reading.  perhaps slight ankle swelling with higher dose if misses compression stockings  Trying to eat healthy diet and stay low salt BP Readings from Last 3 Encounters:  09/15/19 138/82  06/10/19 (!) 150/82  06/10/19 (!) 150/82  A/P: home readings look excellent at 128/74 on amlodipine 10 mg- not much difference from 130/76 on 5 mg- hed like to retrial 5 mg and if BP looks good after a  month he would like to retrial aspirin 81 and monitor blood pressure -white coat element in office -sister does really well with losartan and hed like to consider this as option in future  #hyperlipidemia S: not on statin. Has improved diet and wants to see if #s have improved. 10 year ascvd risk of 25%. Avoids red meat for most part- doing mainly chicken and fish Lab Results  Component Value Date   CHOL 209 (H) 09/15/2018   HDL 75.50 09/15/2018   LDLCALC 121 (H) 09/15/2018   LDLDIRECT 100.5 10/27/2011   TRIG 63.0 09/15/2018   CHOLHDL 3 09/15/2018   A/P: hopefully improved controlled- update lipids today - could consider coronary CT- we did not specifically discuss this- he wants to focus on  diet/exercise  # history of melanoma- continues regular follow up with dermatology  # colon cancer screening- 2019 with 3 year repeat  # hed like to continue to screen for prostate cancer- will add this on today. History BPH with nocturia about tiwce a night Lab Results  Component Value Date   PSA 1.73 09/15/2018   PSA 1.54 01/07/2018   PSA 1.43 12/18/2016    Recommended follow up: Return in about 6 months (around 03/14/2020) for follow up- or sooner if needed. Future Appointments  Date Time Provider [place]  09/16/2019  2:30 PM [facility] HEALTH COACH [facility]    Lab/Order associations:   ICD-10-CM   1. Hyperlipidemia, unspecified hyperlipidemia type  E78.5 CBC with Differential/Platelet    Comprehensive metabolic panel    Lipid panel  2. Essential hypertension  I10 CBC with Differential/Platelet    Comprehensive metabolic panel    Lipid panel  3. Benign prostatic hyperplasia with nocturia  N40.1 PSA   R35.1     Meds ordered this encounter  Medications  . amLODipine (NORVASC) 5 MG tablet    Sig: Take 1 tablet (5 mg total) by mouth daily.    Dispense:  90 tablet    Refill:  1    Time Spent: 32 minutes of total time (1:54 PM- 2:26 PM) was spent on the date of the encounter performing the following actions: chart review prior to seeing the patient, obtaining history, performing a medically necessary exam, counseling on the treatment plan, placing orders, and documenting in our EHR.   Return precautions advised.  Garret Reddish, MD

## 2019-09-15 NOTE — Patient Instructions (Addendum)
Resume 5 mg amlodipine and monitor BP for 1 month. If #s still <135/85 on average-  can retrial aspirin as long as #s do not increase significantly  Please stop by lab before you go If you do not have mychart- we will call you about results within 5 business days of [place] receiving them.  If you have mychart- we will send your results within 3 business days of [place] receiving them.  If abnormal or we want to clarify a result, we will call or mychart you to make sure you receive the message.  If you have questions or concerns or don't hear within 5-7 days, please send [place] a message or call us.    Recommended follow up: Return in about 6 months (around 03/14/2020) for follow up- or sooner if needed.

## 2019-09-15 NOTE — Assessment & Plan Note (Signed)
S:compliant with amlodipine 10 mg.  Has been checking readings at home and after mediation changes in December readings have been on average 128/74 on 10 mg, had been 130/76 prior on 5 mg over a month reading.  perhaps slight ankle swelling with higher dose if misses compression stockings  Trying to eat healthy diet and stay low salt BP Readings from Last 3 Encounters:  09/15/19 138/82  06/10/19 (!) 150/82  06/10/19 (!) 150/82  A/P: home readings look excellent at 128/74 on amlodipine 10 mg- not much difference from 130/76 on 5 mg- hed like to retrial 5 mg and if BP looks good after a month he would like to retrial aspirin 81 and monitor blood pressure -white coat element in office -sister does really well with losartan and hed like to consider this as option in future

## 2019-09-16 ENCOUNTER — Encounter: Payer: Self-pay | Admitting: Family Medicine

## 2019-09-16 ENCOUNTER — Ambulatory Visit: Payer: Medicare Other

## 2019-09-16 NOTE — Addendum Note (Signed)
Addended by: Marin Olp on: 09/16/2019 05:12 PM   Modules accepted: Orders

## 2019-09-19 ENCOUNTER — Telehealth: Payer: Self-pay

## 2019-09-19 NOTE — Telephone Encounter (Signed)
Pt returning call best contact number [phone]

## 2019-10-12 ENCOUNTER — Other Ambulatory Visit: Payer: Self-pay

## 2019-10-12 ENCOUNTER — Ambulatory Visit (INDEPENDENT_AMBULATORY_CARE_PROVIDER_SITE_OTHER)
Admission: RE | Admit: 2019-10-12 | Discharge: 2019-10-12 | Disposition: A | Discharge status: Home / Self Care | Payer: Self-pay | Source: Ambulatory Visit | Attending: Family Medicine | Admitting: Family Medicine

## 2019-10-12 DIAGNOSIS — E785 Hyperlipidemia, unspecified: Secondary | ICD-10-CM

## 2019-10-14 ENCOUNTER — Encounter: Payer: Self-pay | Admitting: Family Medicine

## 2019-10-22 ENCOUNTER — Encounter: Payer: Self-pay | Admitting: Family Medicine

## 2019-12-01 ENCOUNTER — Telehealth: Payer: Self-pay | Admitting: Family Medicine

## 2019-12-01 NOTE — Telephone Encounter (Signed)
Called and lm for pt tcb. 

## 2019-12-01 NOTE — Telephone Encounter (Signed)
Received a call from [place] this morning, and is returning her phone call.

## 2019-12-01 NOTE — Telephone Encounter (Signed)
Patient returning Keba's message. Please Advise. jk

## 2020-01-26 ENCOUNTER — Encounter: Payer: Self-pay | Admitting: Family Medicine

## 2020-02-14 ENCOUNTER — Encounter: Payer: Self-pay | Admitting: Registered Nurse

## 2020-02-14 ENCOUNTER — Other Ambulatory Visit: Payer: Self-pay

## 2020-02-14 ENCOUNTER — Ambulatory Visit (INDEPENDENT_AMBULATORY_CARE_PROVIDER_SITE_OTHER): Payer: Medicare Other | Admitting: Registered Nurse

## 2020-02-14 VITALS — BP 178/82 | HR 84 | Temp 98.2°F | Resp 18 | Ht 65.5 in | Wt 150.2 lb

## 2020-02-14 DIAGNOSIS — T63481A Toxic effect of venom of other arthropod, accidental (unintentional), initial encounter: Secondary | ICD-10-CM | POA: Diagnosis not present

## 2020-02-14 MED ORDER — BETAMETHASONE DIPROPIONATE 0.05 % EX CREA: TOPICAL_CREAM | Freq: Two times a day (BID) | CUTANEOUS | 0 refills | Status: DC

## 2020-02-14 NOTE — Patient Instructions (Signed)
° ° ° °  If you have lab work done today you will be contacted with your lab results within the next 2 weeks.  If you have not heard from us then please contact us. The fastest way to get your results is to register for My Chart. ° ° °IF you received an x-ray today, you will receive an invoice from [facility]. Please contact [facility] at [phone] with questions or concerns regarding your invoice.  ° °IF you received labwork today, you will receive an invoice from [facility]. Please contact [facility] at [phone] with questions or concerns regarding your invoice.  ° °Our billing staff will not be able to assist you with questions regarding bills from these companies. ° °You will be contacted with the lab results as soon as they are available. The fastest way to get your results is to activate your My Chart account. Instructions are located on the last page of this paperwork. If you have not heard from us regarding the results in 2 weeks, please contact this office. °  ° ° ° °

## 2020-03-02 ENCOUNTER — Telehealth: Payer: Self-pay | Admitting: Family Medicine

## 2020-03-02 NOTE — Progress Notes (Signed)
  Chronic Care Management   Outreach Note  03/02/2020 Name: Thomas Craig MRN: [id number] DOB: 28-Jul-1946  Referred by: Marin Olp, MD Reason for referral : No chief complaint on file.   An unsuccessful telephone outreach was attempted today. The patient was referred to the pharmacist for assistance with care management and care coordination.   Follow Up Plan:   Earney Hamburg Upstream Scheduler

## 2020-03-02 NOTE — Progress Notes (Signed)
  Chronic Care Management   Note  03/02/2020 Name: Thomas Craig MRN: [id number] DOB: Sep 14, 1945  Thomas Craig is a 74 y.o. year old male who is a primary care patient of Marin Olp, MD. I reached out to [name] by phone today in response to a referral sent by Thomas Craig PCP, Marin Olp, MD.   Thomas Craig was given information about Chronic Care Management services today including:  1. CCM service includes personalized support from designated clinical staff supervised by his physician, including individualized plan of care and coordination with other care providers 2. 24/7 contact phone numbers for assistance for urgent and routine care needs. 3. Service will only be billed when office clinical staff spend 20 minutes or more in a month to coordinate care. 4. Only one practitioner may furnish and bill the service in a calendar month. 5. The patient may stop CCM services at any time (effective at the end of the month) by phone call to the office staff.   Patient agreed to services and verbal consent obtained.   Follow up plan:   Earney Hamburg Upstream Scheduler

## 2020-03-09 ENCOUNTER — Other Ambulatory Visit: Payer: Self-pay

## 2020-03-09 ENCOUNTER — Encounter: Payer: Self-pay | Admitting: Family Medicine

## 2020-03-09 MED ORDER — AMLODIPINE BESYLATE 5 MG PO TABS: 5.0000 mg | ORAL_TABLET | Freq: Every day | ORAL | 1 refills | Status: DC

## 2020-03-12 NOTE — Progress Notes (Deleted)
Phone [phone] In person visit   Subjective:   Thomas Craig is a 74 y.o. year old very pleasant male patient who presents for/with See problem oriented charting No chief complaint on file.   This visit occurred during the SARS-CoV-2 public health emergency.  Safety protocols were in place, including screening questions prior to the visit, additional usage of staff PPE, and extensive cleaning of exam room while observing appropriate contact time as indicated for disinfecting solutions.   Past Medical History-  Patient Active Problem List   Diagnosis Date Noted  . History of adenomatous polyp of colon 02/23/2018  . History of hematuria 06/19/2016  . History of melanoma 09/05/2014  . BPH (benign prostatic hyperplasia) 09/05/2014  . Essential hypertension 09/05/2014  . Hyperlipidemia 09/05/2014  . Bunion of great toe of left foot 10/20/2011    Medications- reviewed and updated Current Outpatient Medications  Medication Sig Dispense Refill  . amLODipine (NORVASC) 5 MG tablet Take 1 tablet (5 mg total) by mouth daily. 90 tablet 1  . Ascorbic Acid (VITAMIN C) 1000 MG tablet Take 1,000 mg by mouth daily. 1-3 qd     . betamethasone dipropionate 0.05 % cream Apply topically 2 (two) times daily. 30 g 0  . Calcium Carbonate-Vitamin D (CALCIUM-VITAMIN D) 500-200 MG-UNIT per tablet Take 1 tablet by mouth daily.   (Patient not taking: Reported on 02/14/2020)    . cyanocobalamin 500 MCG tablet Take 500 mcg by mouth daily.    Marland Kitchen doxylamine, Sleep, (UNISOM) 25 MG tablet Take 25 mg by mouth at bedtime as needed.    . Glucosamine-Chondroit-Vit C-Mn (GLUCOSAMINE CHONDR 1500 COMPLX) CAPS Take 2 capsules by mouth daily.     . Multiple Vitamin (MULTIVITAMIN) tablet Take 1 tablet by mouth daily.      . multivitamin-lutein (OCUVITE-LUTEIN) CAPS Take 1 capsule by mouth daily.    . OMEGA-3 1000 MG CAPS Take 4 capsules by mouth daily.      . Saw Palmetto, Serenoa repens, 1000 MG CAPS Take by mouth 1  day or 1 dose.      No current facility-administered medications for this visit.     Objective:  There were no vitals taken for this visit. Gen: NAD, resting comfortably CV: RRR no murmurs rubs or gallops Lungs: CTAB no crackles, wheeze, rhonchi Abdomen: soft/nontender/nondistended/normal bowel sounds. No rebound or guarding.  Ext: no edema Skin: warm, dry Neuro: grossly normal, moves all extremities  ***    Assessment and Plan   #hypertension S: medication: *** Home readings #s: *** BP Readings from Last 3 Encounters:  02/14/20 (!) 178/82  09/15/19 138/82  06/10/19 (!) 150/82  A/P: ***  #hyperlipidemia S: Medication:***  Lab Results  Component Value Date   CHOL 221 (H) 09/15/2019   HDL 92.00 09/15/2019   LDLCALC 118 (H) 09/15/2019   LDLDIRECT 100.5 10/27/2011   TRIG 56.0 09/15/2019   CHOLHDL 2 09/15/2019   A/P: ***   ***coronary calcium score 32. 23% for ag.   ***mychart 10/24/19 asa- It seems like your blood pressure does very slightly better (4 points systolic)  without aspirin. Another option since there is some plaque on the heart would be to do aspirin like once a week (its an irreversible platelet inhibitor so it has more prolonged benefit for platelets it "hits"). This would have lower bleeding risks and may have some cardiovascular benefit- let me know your thoughts  No problem-specific Assessment & Plan notes found for this encounter.   Recommended follow up: ***No  follow-ups on file. Future Appointments  Date Time Provider [place]  03/15/2020  1:20 PM Marin Olp, MD [facility]  03/20/2020  2:00 PM [facility] CCM PHARMACIST [facility]    Lab/Order associations: No diagnosis found.  No orders of the defined types were placed in this encounter.   Time Spent: *** minutes of total time (4:31 PM***- 4:31 PM***) was spent on the date of the encounter performing the following actions: chart review prior to seeing the patient, obtaining  history, performing a medically necessary exam, counseling on the treatment plan, placing orders, and documenting in our EHR.   Return precautions advised.  Clyde Lundborg, CMA

## 2020-03-15 ENCOUNTER — Ambulatory Visit: Payer: Medicare Other | Admitting: Family Medicine

## 2020-03-20 ENCOUNTER — Ambulatory Visit: Payer: Medicare Other

## 2020-03-20 DIAGNOSIS — E785 Hyperlipidemia, unspecified: Secondary | ICD-10-CM

## 2020-03-20 DIAGNOSIS — I10 Essential (primary) hypertension: Secondary | ICD-10-CM

## 2020-03-20 NOTE — Progress Notes (Signed)
Chronic Care Management Pharmacy  Name: Thomas Craig  MRN: [id number] DOB: 1946-04-11  Chief Complaint/ HPI  Thomas Craig,  74 y.o. , male presents for their Initial CCM visit with the clinical pharmacist via telephone due to COVID-19 Pandemic.  Current exercise - running 5x/wk, tennis lessons.   PCP : Thomas Olp, MD  Chronic conditions include:  Encounter Diagnoses  Name Primary?  . Essential hypertension Yes  . Hyperlipidemia, unspecified hyperlipidemia type      Patient Active Problem List   Diagnosis Date Noted  . History of adenomatous polyp of colon 02/23/2018  . History of hematuria 06/19/2016  . History of melanoma 09/05/2014  . BPH (benign prostatic hyperplasia) 09/05/2014  . Essential hypertension 09/05/2014  . Hyperlipidemia 09/05/2014  . Bunion of great toe of left foot 10/20/2011   Past Surgical History:  Procedure Laterality Date  . COLONOSCOPY  10/2007   kaplan - normal  . MELANOMA EXCISION     right arm  . TOOTH EXTRACTION     only 1 wisdom tooth ext   Social History   Socioeconomic History  . Marital status: Married    Spouse name: Not on file  . Number of children: Not on file  . Years of education: Not on file  . Highest education level: Not on file  Occupational History  . Not on file  Tobacco Use  . Smoking status: Former Smoker    Packs/day: 0.75    Years: 5.00    Pack years: 3.75    Types: Cigarettes    Quit date: 08/05/1975    Years since quitting: 44.6  . Smokeless tobacco: Never Used  . Tobacco comment: ONLY SMOKED FOR 5 Y EARS or less with limited cigarettes  Vaping Use  . Vaping Use: Never used  Substance and Sexual Activity  . Alcohol use: Yes    Alcohol/week: 10.0 - 14.0 standard drinks    Types: 10 - 14 Standard drinks or equivalent per week    Comment: Beer/wine  . Drug use: No  . Sexual activity: Yes  Other Topics Concern  . Not on file  Social History Narrative   Married 1975 (Wife with another  Conservation officer, historic buildings). 3 daughters (38, 36,34 in 2016). 2 granddaughters (only youngest married)      Retired from [employer] for [employer]	[place]. Ran tax group.    Doing some consulting. Increase exercise. Time with dogs. Started back with rotary club). Traveling more.  Marathon October 2017 in [place].       Hobbies: running (huge passion runs marathons), biking, gardening, occasional golf   Social Determinants of Health   Financial Resource Strain:   . Difficulty of Paying Living Expenses:   Food Insecurity: No Food Insecurity  . Worried About Charity fundraiser in the Last Year: Never true  . Ran Out of Food in the Last Year: Never true  Transportation Needs:   . Lack of Transportation (Medical):   Marland Kitchen Lack of Transportation (Non-Medical):   Physical Activity: Unknown  . Days of Exercise per Week: Not on file  . Minutes of Exercise per Session: 30 min  Stress:   . Feeling of Stress :   Social Connections:   . Frequency of Communication with Friends and Family:   . Frequency of Social Gatherings with Friends and Family:   . Attends Religious Services:   . Active Member of Clubs or Organizations:   . Attends Archivist Meetings:   .  Marital Status:    Family History  Problem Relation Age of Onset  . Alzheimer's disease Mother        lived to 95  . Melanoma Father   . Alzheimer's disease Father        lived to 64  . Hypertension Father   . Anxiety disorder Sister        agoraphobia  . Colon cancer Neg Hx   . Rectal cancer Neg Hx   . Stomach cancer Neg Hx    Allergies  Allergen Reactions  . Sulfa Antibiotics    Outpatient Encounter Medications as of 03/20/2020  Medication Sig  . amLODipine (NORVASC) 5 MG tablet Take 1 tablet (5 mg total) by mouth daily.  . Ascorbic Acid (VITAMIN C) 1000 MG tablet Take 3,000 mg by mouth daily.   Marland Kitchen aspirin EC 81 MG tablet Take 81 mg by mouth daily. Swallow whole.  . betamethasone dipropionate 0.05 % cream Apply topically 2  (two) times daily.  . cyanocobalamin 500 MCG tablet Take 500 mcg by mouth daily.  Marland Kitchen doxylamine, Sleep, (UNISOM) 25 MG tablet Take 25 mg by mouth at bedtime as needed.  . Glucosamine-Chondroit-Vit C-Mn (GLUCOSAMINE CHONDR 1500 COMPLX) CAPS Take 2 capsules by mouth daily.   . Multiple Vitamin (MULTIVITAMIN) tablet Take 1 tablet by mouth daily.    . multivitamin-lutein (OCUVITE-LUTEIN) CAPS Take 1 capsule by mouth daily.  . Omega-3 Fatty Acids (OMEGA-3 PO) Take 4 capsules by mouth daily.   . Saw Palmetto, Serenoa repens, 1000 MG CAPS Take 2 capsules by mouth daily.   . Calcium Carbonate-Vitamin D (CALCIUM-VITAMIN D) 500-200 MG-UNIT per tablet Take 1 tablet by mouth daily.   (Patient not taking: Reported on 02/14/2020)   No facility-administered encounter medications on file as of 03/20/2020.   Patient Care Team    Relationship Specialty Notifications Start End  Thomas Olp, MD PCP - General Family Medicine  09/05/14   Madelin Rear, [facility] Pharmacist Pharmacist  03/02/20    Comment: [phone]   Current Diagnosis/Assessment: Goals Addressed            This Visit's Progress   . PharmD Care Plan       CARE PLAN ENTRY (see longitudinal plan of care for additional care plan information)  Current Barriers:  . Chronic Disease Management support, education, and care coordination needs related to Hypertension and Hyperlipidemia   Hypertension BP Readings from Last 3 Encounters:  02/14/20 (!) 178/82  09/15/19 138/82  06/10/19 (!) 150/82   . Pharmacist Clinical Goal(s): o Over the next 180 days, patient will work with PharmD and providers to achieve BP goal <130/80 . Current regimen:  o Amlodipine 5 mg once daily  . Interventions: o Continue current management . Patient self care activities - Over the next 180 days, patient will: o Check BP at least once every 1-2 weeks, document, and provide at future appointments o Ensure daily salt intake < 2300 mg/day  Hyperlipidemia Lab  Results  Component Value Date/Time   LDLCALC 118 (H) 09/15/2019 02:29 PM   LDLDIRECT 100.5 10/27/2011 10:41 AM   . Pharmacist Clinical Goal(s): o Over the next 180 days, patient will work with PharmD and providers to achieve LDL goal < 70 . Current regimen:  o No current medications . Interventions: o Reviewed diet/exercise recommendations and discussed risk benefit of statin use . Patient self care activities - Over the next 180 days, patient will: o Continue current management o Further discuss statin with PCP 09/2019.  Medication management . Pharmacist Clinical Goal(s): o Over the next 180 days, patient will work with PharmD and providers to maintain optimal medication adherence. . Current pharmacy: [facility] . Interventions o Comprehensive medication review performed. o Continue current medication management strategy . Patient self care activities - Over the next 180 days, patient will: o Take medications as prescribed o Report any questions or concerns to PharmD and/or provider(s)  Initial goal documentation.      Hypertension   BP goal <130/80  BP Readings from Last 3 Encounters:  02/14/20 (!) 178/82  09/15/19 138/82  06/10/19 (!) 150/82   Patient checks BP at home 1-2x per week. Patient home BP readings are ranging: 120-130/70s.  Patient is currently  on the following medications:  . Amlodipine 10 mg once daily  We discussed diet and exercise extensively.  Plan  Continue current medications and control with diet and exercise.   Hyperlipidemia   LDL goal < 70  Lipid Panel     Component Value Date/Time   CHOL 221 (H) 09/15/2019 1429   TRIG 56.0 09/15/2019 1429   HDL 92.00 09/15/2019 1429   LDLCALC 118 (H) 09/15/2019 1429   LDLDIRECT 100.5 10/27/2011 1041    Hepatic Function Latest Ref Rng & Units 09/15/2019 09/15/2018 01/07/2018  Total Protein 6.0 - 8.3 g/dL 7.0 6.0 6.4  Albumin 3.5 - 5.2 g/dL 4.6 3.9 4.2  AST 0 - 37 U/L '27 30 27  '$ ALT 0 -  53 U/L '22 25 28  '$ Alk Phosphatase 39 - 117 U/L 33(L) 27(L) 31(L)  Total Bilirubin 0.2 - 1.2 mg/dL 0.9 0.8 0.9  Bilirubin, Direct 0.0 - 0.3 mg/dL - - -    Current above goal on:  No current medications   We discussed: diet/exercise, reviewed risk/benefit of statin medications.   Plan  Continue control with diet and exercise. 06/2020 f/u with PCP  Vaccines   Immunization History  Administered Date(s) Administered  . Influenza Whole 07/04/2009  . Influenza, High Dose Seasonal PF 03/30/2019  . Influenza,inj,Quad PF,6+ Mos 04/20/2013, 03/19/2016, 05/25/2017  . Influenza-Unspecified 05/16/2014, 05/16/2015, 06/18/2018  . PFIZER SARS-COV-2 Vaccination 08/24/2019, 09/14/2019  . Pneumococcal Conjugate-13 09/05/2014  . Pneumococcal Polysaccharide-23 10/18/2012  . Td 08/04/2005  . Tdap 09/20/2015  . Zoster 10/18/2012   Reviewed and discussed patient's vaccination history.  Up to date on everything with exception on Shingrix.  Plan  Recommended patient receive shingrix vaccine in pharmacy.   Medication Management Coordination   Receives prescription medications from:  [facility], [address] [place] [address] Phone: [phone] Fax: [phone]   Denies any issues with current medication management.   Plan  Continue current medication management strategy.  SDOH (Social Determinants of Health) assessments performed: Yes. ____________________________ Future Appointments  Date Time Provider [place]  06/14/2020  1:40 PM Thomas Olp, MD [facility]  09/25/2020  1:00 PM [facility] CCM PHARMACIST [facility]   Visit follow-up:  . CPA follow-up: n/a. Marland Kitchen RPH follow-up: 6 month phone visit.  Madelin Rear, Pharm.D., BCGP Clinical Pharmacist [place] [phone]

## 2020-03-20 NOTE — Patient Instructions (Addendum)
Hi Thomas Craig - It was great meeting you over the phone. Please call me at [phone] (direct line) with any questions. Thank you! -Edyth Gunnels., Clinical Pharmacist  Goals Addressed            This Visit's Progress   . PharmD Care Plan       CARE PLAN ENTRY (see longitudinal plan of care for additional care plan information)  Current Barriers:  . Chronic Disease Management support, education, and care coordination needs related to Hypertension and Hyperlipidemia   Hypertension BP Readings from Last 3 Encounters:  02/14/20 (!) 178/82  09/15/19 138/82  06/10/19 (!) 150/82  Recent home Bps: 120-130/80s  . Pharmacist Clinical Goal(s): o Over the next 180 days, patient will work with PharmD and providers to maintain BP goal <130/80 . Current regimen:  o Amlodipine 5 mg once daily  . Interventions: o Continue current management . Patient self care activities - Over the next 180 days, patient will: o Check BP at least once every 1-2 weeks, document, and provide at future appointments o Ensure daily salt intake < 2300 mg/day  Hyperlipidemia Lab Results  Component Value Date/Time   LDLCALC 118 (H) 09/15/2019 02:29 PM   LDLDIRECT 100.5 10/27/2011 10:41 AM   . Pharmacist Clinical Goal(s): o Over the next 180 days, patient will work with PharmD and providers to achieve LDL goal < 70 . Current regimen:  o No current medications . Interventions: o Reviewed diet/exercise recommendations and discussed risk benefit of statin use . Patient self care activities - Over the next 180 days, patient will: o Continue current management o Further discuss statin with PCP 09/2019.  Medication management . Pharmacist Clinical Goal(s): o Over the next 180 days, patient will work with PharmD and providers to maintain optimal medication adherence. . Current pharmacy: [facility] . Interventions o Comprehensive medication review performed. o Continue current medication management  strategy . Patient self care activities - Over the next 180 days, patient will: o Take medications as prescribed o Report any questions or concerns to PharmD and/or provider(s)  Initial goal documentation.      Thomas Craig was given information about Chronic Care Management services today including:  1. CCM service includes personalized support from designated clinical staff supervised by his physician, including individualized plan of care and coordination with other care providers 2. 24/7 contact phone numbers for assistance for urgent and routine care needs. 3. Standard insurance, coinsurance, copays and deductibles apply for chronic care management only during months in which we provide at least 20 minutes of these services. Most insurances cover these services at 100%, however patients may be responsible for any copay, coinsurance and/or deductible if applicable. This service may help you avoid the need for more expensive face-to-face services. 4. Only one practitioner may furnish and bill the service in a calendar month. 5. The patient may stop CCM services at any time (effective at the end of the month) by phone call to the office staff.  Patient agreed to services and verbal consent obtained.   The patient verbalized understanding of instructions provided today and agreed to receive a mailed copy of patient instruction and/or educational materials. Telephone follow up appointment with pharmacy team member scheduled for: See next appointment with "Care Management Staff" under "What's Next" below.   Madelin Rear, Pharm.D., BCGP Clinical Pharmacist [facility] [phone]   Hypertension, Adult High blood pressure (hypertension) is when the force of blood pumping through the arteries is too strong. The  arteries are the blood vessels that carry blood from the heart throughout the body. Hypertension forces the heart to work harder to pump blood and may cause arteries to  become narrow or stiff. Untreated or uncontrolled hypertension can cause a heart attack, heart failure, a stroke, kidney disease, and other problems. A blood pressure reading consists of a higher number over a lower number. Ideally, your blood pressure should be below 120/80. The first ("top") number is called the systolic pressure. It is a measure of the pressure in your arteries as your heart beats. The second ("bottom") number is called the diastolic pressure. It is a measure of the pressure in your arteries as the heart relaxes. What are the causes? The exact cause of this condition is not known. There are some conditions that result in or are related to high blood pressure. What increases the risk? Some risk factors for high blood pressure are under your control. The following factors may make you more likely to develop this condition:  Smoking.  Having type 2 diabetes mellitus, high cholesterol, or both.  Not getting enough exercise or physical activity.  Being overweight.  Having too much fat, sugar, calories, or salt (sodium) in your diet.  Drinking too much alcohol. Some risk factors for high blood pressure may be difficult or impossible to change. Some of these factors include:  Having chronic kidney disease.  Having a family history of high blood pressure.  Age. Risk increases with age.  Race. You may be at higher risk if you are African American.  Gender. Men are at higher risk than women before age 48. After age 69, women are at higher risk than men.  Having obstructive sleep apnea.  Stress. What are the signs or symptoms? High blood pressure may not cause symptoms. Very high blood pressure (hypertensive crisis) may cause:  Headache.  Anxiety.  Shortness of breath.  Nosebleed.  Nausea and vomiting.  Vision changes.  Severe chest pain.  Seizures. How is this diagnosed? This condition is diagnosed by measuring your blood pressure while you are seated,  with your arm resting on a flat surface, your legs uncrossed, and your feet flat on the floor. The cuff of the blood pressure monitor will be placed directly against the skin of your upper arm at the level of your heart. It should be measured at least twice using the same arm. Certain conditions can cause a difference in blood pressure between your right and left arms. Certain factors can cause blood pressure readings to be lower or higher than normal for a short period of time:  When your blood pressure is higher when you are in a health care provider's office than when you are at home, this is called white coat hypertension. Most people with this condition do not need medicines.  When your blood pressure is higher at home than when you are in a health care provider's office, this is called masked hypertension. Most people with this condition may need medicines to control blood pressure. If you have a high blood pressure reading during one visit or you have normal blood pressure with other risk factors, you may be asked to:  Return on a different day to have your blood pressure checked again.  Monitor your blood pressure at home for 1 week or longer. If you are diagnosed with hypertension, you may have other blood or imaging tests to help your health care provider understand your overall risk for other conditions. How is this treated?  This condition is treated by making healthy lifestyle changes, such as eating healthy foods, exercising more, and reducing your alcohol intake. Your health care provider may prescribe medicine if lifestyle changes are not enough to get your blood pressure under control, and if:  Your systolic blood pressure is above 130.  Your diastolic blood pressure is above 80. Your personal target blood pressure may vary depending on your medical conditions, your age, and other factors. Follow these instructions at home: Eating and drinking   Eat a diet that is high in fiber  and potassium, and low in sodium, added sugar, and fat. An example eating plan is called the DASH (Dietary Approaches to Stop Hypertension) diet. To eat this way: ? Eat plenty of fresh fruits and vegetables. Try to fill one half of your plate at each meal with fruits and vegetables. ? Eat whole grains, such as whole-wheat pasta, brown rice, or whole-grain bread. Fill about one fourth of your plate with whole grains. ? Eat or drink low-fat dairy products, such as skim milk or low-fat yogurt. ? Avoid fatty cuts of meat, processed or cured meats, and poultry with skin. Fill about one fourth of your plate with lean proteins, such as fish, chicken without skin, beans, eggs, or tofu. ? Avoid pre-made and processed foods. These tend to be higher in sodium, added sugar, and fat.  Reduce your daily sodium intake. Most people with hypertension should eat less than 1,500 mg of sodium a day.  Do not drink alcohol if: ? Your health care provider tells you not to drink. ? You are pregnant, may be pregnant, or are planning to become pregnant.  If you drink alcohol: ? Limit how much you use to:  0-1 drink a day for women.  0-2 drinks a day for men. ? Be aware of how much alcohol is in your drink. In the [place] one drink equals one 12 oz bottle of beer (355 mL), one 5 oz glass of wine (148 mL), or one 1 oz glass of hard liquor (44 mL). Lifestyle   Work with your health care provider to maintain a healthy body weight or to lose weight. Ask what an ideal weight is for you.  Get at least 30 minutes of exercise most days of the week. Activities may include walking, swimming, or biking.  Include exercise to strengthen your muscles (resistance exercise), such as Pilates or lifting weights, as part of your weekly exercise routine. Try to do these types of exercises for 30 minutes at least 3 days a week.  Do not use any products that contain nicotine or tobacco, such as cigarettes, e-cigarettes, and chewing  tobacco. If you need help quitting, ask your health care provider.  Monitor your blood pressure at home as told by your health care provider.  Keep all follow-up visits as told by your health care provider. This is important. Medicines  Take over-the-counter and prescription medicines only as told by your health care provider. Follow directions carefully. Blood pressure medicines must be taken as prescribed.  Do not skip doses of blood pressure medicine. Doing this puts you at risk for problems and can make the medicine less effective.  Ask your health care provider about side effects or reactions to medicines that you should watch for. Contact a health care provider if you:  Think you are having a reaction to a medicine you are taking.  Have headaches that keep coming back (recurring).  Feel dizzy.  Have swelling in your ankles.  Have trouble with your vision. Get help right away if you:  Develop a severe headache or confusion.  Have unusual weakness or numbness.  Feel faint.  Have severe pain in your chest or abdomen.  Vomit repeatedly.  Have trouble breathing. Summary  Hypertension is when the force of blood pumping through your arteries is too strong. If this condition is not controlled, it may put you at risk for serious complications.  Your personal target blood pressure may vary depending on your medical conditions, your age, and other factors. For most people, a normal blood pressure is less than 120/80.  Hypertension is treated with lifestyle changes, medicines, or a combination of both. Lifestyle changes include losing weight, eating a healthy, low-sodium diet, exercising more, and limiting alcohol. This information is not intended to replace advice given to you by your health care provider. Make sure you discuss any questions you have with your health care provider. Document Revised: 03/31/2018 Document Reviewed: 03/31/2018 Elsevier Patient Education  2020  [employer].

## 2020-04-03 ENCOUNTER — Other Ambulatory Visit: Payer: Self-pay

## 2020-04-03 DIAGNOSIS — I10 Essential (primary) hypertension: Secondary | ICD-10-CM

## 2020-04-03 DIAGNOSIS — E785 Hyperlipidemia, unspecified: Secondary | ICD-10-CM

## 2020-04-05 DIAGNOSIS — L570 Actinic keratosis: Secondary | ICD-10-CM | POA: Diagnosis not present

## 2020-04-05 DIAGNOSIS — D229 Melanocytic nevi, unspecified: Secondary | ICD-10-CM | POA: Diagnosis not present

## 2020-04-05 DIAGNOSIS — Z85820 Personal history of malignant melanoma of skin: Secondary | ICD-10-CM | POA: Diagnosis not present

## 2020-04-05 DIAGNOSIS — L905 Scar conditions and fibrosis of skin: Secondary | ICD-10-CM | POA: Diagnosis not present

## 2020-04-05 DIAGNOSIS — L819 Disorder of pigmentation, unspecified: Secondary | ICD-10-CM | POA: Diagnosis not present

## 2020-04-05 DIAGNOSIS — L821 Other seborrheic keratosis: Secondary | ICD-10-CM | POA: Diagnosis not present

## 2020-04-05 DIAGNOSIS — L814 Other melanin hyperpigmentation: Secondary | ICD-10-CM | POA: Diagnosis not present

## 2020-04-14 ENCOUNTER — Ambulatory Visit: Payer: Medicare Other | Attending: Internal Medicine

## 2020-04-14 DIAGNOSIS — Z23 Encounter for immunization: Secondary | ICD-10-CM

## 2020-04-14 NOTE — Progress Notes (Signed)
   Covid-19 Vaccination Clinic  Name:  Thomas Craig    MRN: [id number] DOB: Dec 26, 1945  04/14/2020  Thomas Craig was observed post Covid-19 immunization for 15 minutes without incident. He was provided with Vaccine Information Sheet and instruction to access the V-Safe system.   Thomas Craig was instructed to call 911 with any severe reactions post vaccine: Marland Kitchen Difficulty breathing  . Swelling of face and throat  . A fast heartbeat  . A bad rash all over body  . Dizziness and weakness

## 2020-05-01 ENCOUNTER — Encounter: Payer: Self-pay | Admitting: Registered Nurse

## 2020-05-01 NOTE — Progress Notes (Signed)
Acute Office Visit  Subjective:    Patient ID: Thomas Craig, male    DOB: 07-01-1946, 74 y.o.   MRN: [id number]  Chief Complaint  Patient presents with  . Insect Bite    Patient states he was running [place] sunday and got stung by yellow jackets multiple time. Marks on left buttock, right side of stomach and 2 times on the left arm which is the worse. Per patient he used a cream that stopped the itching.    HPI Patient is in today for bee stings Was running on Sunday, noted that he saw wasps Was stung multiple times on L buttock, stomach, and L arm Had immediate pain, followed by redness and swelling, then intense itching Has been using OTCs to stop itch Wants to ensure no long term reaction or further intervention needed   Past Medical History:  Diagnosis Date  . BPH (benign prostatic hyperplasia) 09/05/2014   Saw palmetto has improved nocturia 3-5x a night to 1-2x a night.    . Bunion of great toe of left foot 10/20/2011   Hallux valgus shift on left with some early changes starting on RT   . Hypertension   . Melanoma of skin ([place]) 09/05/2014   Dr. Allyson Sabal. Every 6 month follow up.      Past Surgical History:  Procedure Laterality Date  . COLONOSCOPY  10/2007   [facility] - normal  . MELANOMA EXCISION     right arm  . TOOTH EXTRACTION     only 1 wisdom tooth ext    Family History  Problem Relation Age of Onset  . Alzheimer's disease Mother        lived to 67  . Melanoma Father   . Alzheimer's disease Father        lived to 27  . Hypertension Father   . Anxiety disorder Sister        agoraphobia  . Colon cancer Neg Hx   . Rectal cancer Neg Hx   . Stomach cancer Neg Hx     Social History   Socioeconomic History  . Marital status: Married    Spouse name: Not on file  . Number of children: Not on file  . Years of education: Not on file  . Highest education level: Not on file  Occupational History  . Not on file  Tobacco Use  . Smoking status: Former Smoker     Packs/day: 0.75    Years: 5.00    Pack years: 3.75    Types: Cigarettes    Quit date: 08/05/1975    Years since quitting: 44.7  . Smokeless tobacco: Never Used  . Tobacco comment: ONLY SMOKED FOR 5 Y EARS or less with limited cigarettes  Vaping Use  . Vaping Use: Never used  Substance and Sexual Activity  . Alcohol use: Yes    Alcohol/week: 10.0 - 14.0 standard drinks    Types: 10 - 14 Standard drinks or equivalent per week    Comment: Beer/wine  . Drug use: No  . Sexual activity: Yes  Other Topics Concern  . Not on file  Social History Narrative   Married 1975 (Wife with another Conservation officer, historic buildings). 3 daughters (38, 36,34 in 2016). 2 granddaughters (only youngest married)      Retired from [employer] for [employer]	[place]. Ran tax group.    Doing some consulting. Increase exercise. Time with dogs. Started back with rotary club). Traveling more.  Marathon October 2017 in [place].  Hobbies: running (huge passion runs marathons), biking, gardening, occasional golf   Social Determinants of Health   Financial Resource Strain:   . Difficulty of Paying Living Expenses: Not on file  Food Insecurity: No Food Insecurity  . Worried About Charity fundraiser in the Last Year: Never true  . Ran Out of Food in the Last Year: Never true  Transportation Needs:   . Lack of Transportation (Medical): Not on file  . Lack of Transportation (Non-Medical): Not on file  Physical Activity: Unknown  . Days of Exercise per Week: Not on file  . Minutes of Exercise per Session: 30 min  Stress:   . Feeling of Stress : Not on file  Social Connections:   . Frequency of Communication with Friends and Family: Not on file  . Frequency of Social Gatherings with Friends and Family: Not on file  . Attends Religious Services: Not on file  . Active Member of Clubs or Organizations: Not on file  . Attends Archivist Meetings: Not on file  . Marital Status: Not on file  Intimate Partner  Violence:   . Fear of Current or Ex-Partner: Not on file  . Emotionally Abused: Not on file  . Physically Abused: Not on file  . Sexually Abused: Not on file    Outpatient Medications Prior to Visit  Medication Sig Dispense Refill  . Ascorbic Acid (VITAMIN C) 1000 MG tablet Take 3,000 mg by mouth daily.     . cyanocobalamin 500 MCG tablet Take 500 mcg by mouth daily.    Marland Kitchen doxylamine, Sleep, (UNISOM) 25 MG tablet Take 25 mg by mouth at bedtime as needed.    . Glucosamine-Chondroit-Vit C-Mn (GLUCOSAMINE CHONDR 1500 COMPLX) CAPS Take 2 capsules by mouth daily.     . Multiple Vitamin (MULTIVITAMIN) tablet Take 1 tablet by mouth daily.      . multivitamin-lutein (OCUVITE-LUTEIN) CAPS Take 1 capsule by mouth daily.    . Omega-3 Fatty Acids (OMEGA-3 PO) Take 4 capsules by mouth daily.     . Saw Palmetto, Serenoa repens, 1000 MG CAPS Take 2 capsules by mouth daily.     Marland Kitchen amLODipine (NORVASC) 5 MG tablet Take 1 tablet (5 mg total) by mouth daily. 90 tablet 1  . Calcium Carbonate-Vitamin D (CALCIUM-VITAMIN D) 500-200 MG-UNIT per tablet Take 1 tablet by mouth daily.   (Patient not taking: Reported on 02/14/2020)     No facility-administered medications prior to visit.    Allergies  Allergen Reactions  . Sulfa Antibiotics     Review of Systems  Constitutional: Negative.   HENT: Negative.   Eyes: Negative.   Respiratory: Negative.   Cardiovascular: Negative.   Gastrointestinal: Negative.   Genitourinary: Negative.   Musculoskeletal: Negative.   Skin: Positive for rash.  Neurological: Negative.   Psychiatric/Behavioral: Negative.        Objective:    Physical Exam Vitals and nursing note reviewed.  Constitutional:      General: He is not in acute distress.    Appearance: Normal appearance. He is not ill-appearing, toxic-appearing or diaphoretic.  Cardiovascular:     Rate and Rhythm: Normal rate and regular rhythm.  Pulmonary:     Effort: Pulmonary effort is normal. No  respiratory distress.  Skin:    General: Skin is warm and dry.     Capillary Refill: Capillary refill takes less than 2 seconds.     Coloration: Skin is not jaundiced or pale.     Findings: Lesion (multiple  bee stings noted per locations in HPI. red, warm, mildly tender.) present. No bruising, erythema or rash.  Neurological:     General: No focal deficit present.     Mental Status: He is alert and oriented to person, place, and time. Mental status is at baseline.  Psychiatric:        Mood and Affect: Mood normal.        Behavior: Behavior normal.        Thought Content: Thought content normal.        Judgment: Judgment normal.     BP (!) 178/82   Pulse 84   Temp 98.2 F (36.8 C) (Temporal)   Resp 18   Ht 5' 5.5" (1.664 m)   Wt 150 lb 3.2 oz (68.1 kg)   SpO2 98%   BMI 24.61 kg/m  Wt Readings from Last 3 Encounters:  02/14/20 150 lb 3.2 oz (68.1 kg)  09/15/19 151 lb 6.4 oz (68.7 kg)  06/10/19 151 lb 0.2 oz (68.5 kg)    Health Maintenance Due  Topic Date Due  . INFLUENZA VACCINE  03/04/2020    There are no preventive care reminders to display for this patient.   Lab Results  Component Value Date   TSH 2.40 09/03/2015   Lab Results  Component Value Date   WBC 7.4 09/15/2019   HGB 14.0 09/15/2019   HCT 40.6 09/15/2019   MCV 93.2 09/15/2019   PLT 191.0 09/15/2019   Lab Results  Component Value Date   NA 133 (L) 09/15/2019   K 4.6 09/15/2019   CO2 32 09/15/2019   GLUCOSE 97 09/15/2019   BUN 20 09/15/2019   CREATININE 0.90 09/15/2019   BILITOT 0.9 09/15/2019   ALKPHOS 33 (L) 09/15/2019   AST 27 09/15/2019   ALT 22 09/15/2019   PROT 7.0 09/15/2019   ALBUMIN 4.6 09/15/2019   CALCIUM 10.0 09/15/2019   ANIONGAP 10 11/22/2017   GFR 82.69 09/15/2019   Lab Results  Component Value Date   CHOL 221 (H) 09/15/2019   Lab Results  Component Value Date   HDL 92.00 09/15/2019   Lab Results  Component Value Date   LDLCALC 118 (H) 09/15/2019   Lab Results    Component Value Date   TRIG 56.0 09/15/2019   Lab Results  Component Value Date   CHOLHDL 2 09/15/2019   No results found for: HGBA1C     Assessment & Plan:   Problem List Items Addressed This Visit    None    Visit Diagnoses    Insect stings, accidental or unintentional, initial encounter    -  Primary   Relevant Medications   betamethasone dipropionate 0.05 % cream       Meds ordered this encounter  Medications  . betamethasone dipropionate 0.05 % cream    Sig: Apply topically 2 (two) times daily.    Dispense:  30 g    Refill:  0    Order Specific Question:   Supervising Provider    Answer:   Rutherford Guys [[id number]]   PLAN  Will give betamethasone to further process of relief  Return with any complications  Expected to resolve on it's own, no signs of allergic rxn  Patient encouraged to call clinic with any questions, comments, or concerns.   Maximiano Coss, NP

## 2020-06-13 NOTE — Progress Notes (Signed)
Phone [phone] In person visit   Subjective:   Thomas Craig is a 74 y.o. year old very pleasant male patient who presents for/with See problem oriented charting Chief Complaint  Patient presents with  . Hypertension  . Hyperlipidemia   This visit occurred during the SARS-CoV-2 public health emergency.  Safety protocols were in place, including screening questions prior to the visit, additional usage of staff PPE, and extensive cleaning of exam room while observing appropriate contact time as indicated for disinfecting solutions.   Past Medical History-  Patient Active Problem List   Diagnosis Date Noted  . History of melanoma 09/05/2014    Priority: High  . History of adenomatous polyp of colon 02/23/2018    Priority: Medium  . BPH (benign prostatic hyperplasia) 09/05/2014    Priority: Medium  . Essential hypertension 09/05/2014    Priority: Medium  . Hyperlipidemia 09/05/2014    Priority: Medium  . History of hematuria 06/19/2016    Priority: Low  . Bunion of great toe of left foot 10/20/2011    Priority: Low    Medications- reviewed and updated Current Outpatient Medications  Medication Sig Dispense Refill  . amLODipine (NORVASC) 5 MG tablet Take 1 tablet (5 mg total) by mouth daily. 90 tablet 3  . Ascorbic Acid (VITAMIN C) 1000 MG tablet Take 3,000 mg by mouth daily.     Marland Kitchen aspirin EC 81 MG tablet Take 81 mg by mouth daily. Swallow whole.    . cyanocobalamin 500 MCG tablet Take 500 mcg by mouth daily.    Marland Kitchen doxylamine, Sleep, (UNISOM) 25 MG tablet Take 25 mg by mouth at bedtime as needed.    . Glucosamine-Chondroit-Vit C-Mn (GLUCOSAMINE CHONDR 1500 COMPLX) CAPS Take 3-4 capsules by mouth daily.     . Multiple Vitamin (MULTIVITAMIN) tablet Take 1 tablet by mouth daily.      . multivitamin-lutein (OCUVITE-LUTEIN) CAPS Take 1 capsule by mouth daily.    . Omega-3 Fatty Acids (OMEGA-3 PO) Take 4 capsules by mouth daily.     . Saw Palmetto, Serenoa repens, 1000 MG  CAPS Take 2 capsules by mouth daily.     . betamethasone dipropionate 0.05 % cream Apply topically 2 (two) times daily. (Patient not taking: Reported on 06/14/2020) 30 g 0   No current facility-administered medications for this visit.     Objective:  BP 122/80   Pulse 79   Temp 98.3 F (36.8 C) (Temporal)   Resp 18   Ht 5\' 6"  (1.676 m)   Wt 145 lb 12.8 oz (66.1 kg)   SpO2 99%   BMI 23.53 kg/m  Gen: NAD, resting comfortably CV: RRR no murmurs rubs or gallops Lungs: CTAB no crackles, wheeze, rhonchi Ext: no edema Skin: warm, dry     Assessment and Plan   #hypertension S: medication: amlodipine 5 mg Home readings #s: similar to values today (this one today on lower side). All #s under 140- usually 130s  Keeping his running up- 25 miles a week  BP Readings from Last 3 Encounters:  06/14/20 122/80  02/14/20 (!) 178/82  09/15/19 138/82  A/P: Stable. Continue current medications.   #hyperlipidemia S: Medication:Mild elevations but coronary calcium score only 32-23rd % for age. Aspirin 81 mg daily compliant. Also takes omega 3 Lab Results  Component Value Date   CHOL 221 (H) 09/15/2019   HDL 92.00 09/15/2019   LDLCALC 118 (H) 09/15/2019   LDLDIRECT 100.5 10/27/2011   TRIG 56.0 09/15/2019   CHOLHDL 2 09/15/2019  A/P: With coronary calcium score under 100 I think it is reasonable to remain off statin.  We had a risk benefit discussion about aspirin and he would like to continue aspirin for now-we discussed discontinuing if ever has GI bleeding issues.  With history of colon polyps this may also reduce his colon cancer risk while also reducing cardiovascular risk slightly. -He had in the past noted some blood pressure increased on aspirin perhaps 4 points but with blood pressure looking so excellent without is reasonable to continue    Recommended follow up: Return in about 6 months (around 12/12/2020) for follow up- or sooner if needed. consider that time his yearly/lab  visit- no CPE Future Appointments  Date Time Provider [place]  09/25/2020  1:00 PM [facility] CCM PHARMACIST [facility]    Lab/Order associations:   ICD-10-CM   1. Hyperlipidemia, unspecified hyperlipidemia type  E78.5   2. Essential hypertension  I10     Meds ordered this encounter  Medications  . amLODipine (NORVASC) 5 MG tablet    Sig: Take 1 tablet (5 mg total) by mouth daily.    Dispense:  90 tablet    Refill:  3    Return precautions advised.  Garret Reddish, MD

## 2020-06-14 ENCOUNTER — Encounter: Payer: Self-pay | Admitting: Family Medicine

## 2020-06-14 ENCOUNTER — Ambulatory Visit (INDEPENDENT_AMBULATORY_CARE_PROVIDER_SITE_OTHER): Payer: Medicare Other | Admitting: Family Medicine

## 2020-06-14 ENCOUNTER — Other Ambulatory Visit: Payer: Self-pay

## 2020-06-14 VITALS — BP 122/80 | HR 79 | Temp 98.3°F | Resp 18 | Ht 66.0 in | Wt 145.8 lb

## 2020-06-14 DIAGNOSIS — Z23 Encounter for immunization: Secondary | ICD-10-CM | POA: Diagnosis not present

## 2020-06-14 DIAGNOSIS — I10 Essential (primary) hypertension: Secondary | ICD-10-CM

## 2020-06-14 DIAGNOSIS — E785 Hyperlipidemia, unspecified: Secondary | ICD-10-CM

## 2020-06-14 MED ORDER — AMLODIPINE BESYLATE 5 MG PO TABS: 5.0000 mg | ORAL_TABLET | Freq: Every day | ORAL | 3 refills | Status: DC

## 2020-06-14 NOTE — Patient Instructions (Addendum)
Health Maintenance Due  Topic Date Due  . INFLUENZA VACCINE In office flu shot today high dose 03/04/2020   No changes in meds  Recommended follow up: Return in about 6 months (around 12/12/2020) for follow up- or sooner if needed.

## 2020-07-10 ENCOUNTER — Telehealth: Payer: Self-pay | Admitting: Family Medicine

## 2020-07-10 NOTE — Telephone Encounter (Signed)
Left message for patient to call back and schedule Medicare Annual Wellness Visit (AWV) either virtually OR in office.   Last AWV 06/10/19; please schedule at anytime with [facility] at [facility].  This should be a 45 minute visit.   

## 2020-07-20 DIAGNOSIS — S62346A Nondisplaced fracture of base of fifth metacarpal bone, right hand, initial encounter for closed fracture: Secondary | ICD-10-CM | POA: Diagnosis not present

## 2020-07-26 DIAGNOSIS — H2513 Age-related nuclear cataract, bilateral: Secondary | ICD-10-CM | POA: Diagnosis not present

## 2020-07-26 DIAGNOSIS — H524 Presbyopia: Secondary | ICD-10-CM | POA: Diagnosis not present

## 2020-07-26 DIAGNOSIS — H43813 Vitreous degeneration, bilateral: Secondary | ICD-10-CM | POA: Diagnosis not present

## 2020-07-26 DIAGNOSIS — H5213 Myopia, bilateral: Secondary | ICD-10-CM | POA: Diagnosis not present

## 2020-08-10 DIAGNOSIS — S62346D Nondisplaced fracture of base of fifth metacarpal bone, right hand, subsequent encounter for fracture with routine healing: Secondary | ICD-10-CM | POA: Diagnosis not present

## 2020-08-24 ENCOUNTER — Other Ambulatory Visit: Payer: Self-pay

## 2020-08-24 DIAGNOSIS — Z87891 Personal history of nicotine dependence: Secondary | ICD-10-CM

## 2020-09-11 ENCOUNTER — Encounter: Payer: Self-pay | Admitting: Family Medicine

## 2020-09-11 ENCOUNTER — Other Ambulatory Visit: Payer: Self-pay

## 2020-09-11 MED ORDER — AMLODIPINE BESYLATE 5 MG PO TABS: 5.0000 mg | ORAL_TABLET | Freq: Every day | ORAL | 3 refills | Status: DC

## 2020-09-12 ENCOUNTER — Other Ambulatory Visit: Payer: Self-pay | Admitting: Family Medicine

## 2020-09-12 ENCOUNTER — Ambulatory Visit (HOSPITAL_COMMUNITY)
Admission: RE | Admit: 2020-09-12 | Discharge: 2020-09-12 | Disposition: A | Discharge status: Home / Self Care | Payer: Medicare Other | Source: Ambulatory Visit | Attending: Cardiovascular Disease | Admitting: Cardiovascular Disease

## 2020-09-12 ENCOUNTER — Other Ambulatory Visit: Payer: Self-pay

## 2020-09-12 DIAGNOSIS — Z136 Encounter for screening for cardiovascular disorders: Secondary | ICD-10-CM

## 2020-09-12 DIAGNOSIS — Z87891 Personal history of nicotine dependence: Secondary | ICD-10-CM | POA: Insufficient documentation

## 2020-09-13 DIAGNOSIS — M21962 Unspecified acquired deformity of left lower leg: Secondary | ICD-10-CM | POA: Diagnosis not present

## 2020-09-13 DIAGNOSIS — L600 Ingrowing nail: Secondary | ICD-10-CM | POA: Diagnosis not present

## 2020-09-13 DIAGNOSIS — M21961 Unspecified acquired deformity of right lower leg: Secondary | ICD-10-CM | POA: Diagnosis not present

## 2020-09-13 DIAGNOSIS — R2689 Other abnormalities of gait and mobility: Secondary | ICD-10-CM | POA: Diagnosis not present

## 2020-09-24 NOTE — Progress Notes (Unsigned)
Chronic Care Management Pharmacy Note  09/24/2020 Name:  Thomas Craig MRN:  [id number] DOB:  [birth year]  Subjective: Thomas Craig is an 75 y.o. year old male who is a primary patient of Yong Channel, Brayton Mars, MD.  The CCM team was consulted for assistance with disease management and care coordination needs.   Engaged with patient by telephone for follow up visit in response to provider referral for pharmacy case management and/or care coordination services.   Consent to Services:  The patient was given information about Chronic Care Management services, agreed to services, and gave verbal consent prior to initiation of services.  Please see initial visit note for detailed documentation.  Patient Care Team: Marin Olp, MD as PCP - General (Family Medicine) Madelin Rear, [facility] as Pharmacist (Pharmacist)  Recent office visits: *** Recent consult visits: ***  Objective: Lab Results  Component Value Date   CREATININE 0.90 09/15/2019   BUN 20 09/15/2019   GFR 82.69 09/15/2019   GFRNONAA >60 11/22/2017   GFRAA >60 11/22/2017   NA 133 (L) 09/15/2019   K 4.6 09/15/2019   CALCIUM 10.0 09/15/2019   CO2 32 09/15/2019   Lab Results  Component Value Date/Time   GFR 82.69 09/15/2019 02:29 PM   GFR 69.41 09/15/2018 08:25 AM    Last diabetic Eye exam: No results found for: HMDIABEYEEXA  Last diabetic Foot exam: No results found for: HMDIABFOOTEX  Lab Results  Component Value Date   CHOL 221 (H) 09/15/2019   HDL 92.00 09/15/2019   LDLCALC 118 (H) 09/15/2019   LDLDIRECT 100.5 10/27/2011   TRIG 56.0 09/15/2019   CHOLHDL 2 09/15/2019   Hepatic Function Latest Ref Rng & Units 09/15/2019 09/15/2018 01/07/2018  Total Protein 6.0 - 8.3 g/dL 7.0 6.0 6.4  Albumin 3.5 - 5.2 g/dL 4.6 3.9 4.2  AST 0 - 37 U/L $Remo'27 30 27  'nRYLN$ ALT 0 - 53 U/L $Remo'22 25 28  'MoAKx$ Alk Phosphatase 39 - 117 U/L 33(L) 27(L) 31(L)  Total Bilirubin 0.2 - 1.2 mg/dL 0.9 0.8 0.9  Bilirubin, Direct 0.0 - 0.3 mg/dL - - -   Lab  Results  Component Value Date/Time   TSH 2.40 09/03/2015 08:05 AM   TSH 1.98 10/11/2012 08:41 AM   CBC Latest Ref Rng & Units 09/15/2019 09/15/2018 11/22/2017  WBC 4.0 - 10.5 K/uL 7.4 3.2(L) 7.7  Hemoglobin 13.0 - 17.0 g/dL 14.0 13.3 13.9  Hematocrit 39.0 - 52.0 % 40.6 38.5(L) 39.2  Platelets 150.0 - 400.0 K/uL 191.0 188.0 174   No results found for: VD25OH  Clinical ASCVD: {YES/NO:21197} The 10-year ASCVD risk score Mikey Bussing DC Jr., et al., 2013) is: 21%   Values used to calculate the score:     Age: 34 years     Sex: Male     Is Non-Hispanic African American: No     Diabetic: No     Tobacco smoker: No     Systolic Blood Pressure: 259 mmHg     Is BP treated: Yes     HDL Cholesterol: 92 mg/dL     Total Cholesterol: 221 mg/dL    CAC 32 (23rd percentile for age/sex matched control)  Depression screen [facility] 2/9 02/14/2020 06/10/2019 09/07/2018  Decreased Interest 0 0 0  Down, Depressed, Hopeless 0 0 0  PHQ - 2 Score 0 0 0  Altered sleeping - 0 0  Tired, decreased energy - 0 0  Change in appetite - 0 0  Feeling bad or failure about yourself  -  0 0  Trouble concentrating - 0 0  Moving slowly or fidgety/restless - 0 0  Suicidal thoughts - 0 0  PHQ-9 Score - 0 0  Difficult doing work/chores - Not difficult at all Not difficult at all    Social History   Tobacco Use  Smoking Status Former Smoker   Packs/day: 0.75   Years: 5.00   Pack years: 3.75   Types: Cigarettes   Quit date: 08/05/1975   Years since quitting: 45.1  Smokeless Tobacco Never Used  Tobacco Comment   ONLY SMOKED FOR 5 Y EARS or less with limited cigarettes   BP Readings from Last 3 Encounters:  06/14/20 122/80  02/14/20 (!) 178/82  09/15/19 138/82   Pulse Readings from Last 3 Encounters:  06/14/20 79  02/14/20 84  09/15/19 89   Wt Readings from Last 3 Encounters:  06/14/20 145 lb 12.8 oz (66.1 kg)  02/14/20 150 lb 3.2 oz (68.1 kg)  09/15/19 151 lb 6.4 oz (68.7 kg)   Assessment/Interventions:  Review of patient past medical history, allergies, medications, health status, including review of consultants reports, laboratory and other test data, was performed as part of comprehensive evaluation and provision of chronic care management services.   SDOH:  (Social Determinants of Health) assessments and interventions performed: {yes/no:20286}  CCM Care Plan Allergies  Allergen Reactions   Sulfa Antibiotics    Medications Reviewed Today    Reviewed by Thomes Cake, CMA (Certified Medical Assistant) on 06/14/20 at 1350  Med List Status: <None>  Medication Order Taking? Sig Documenting Provider Last Dose Status Informant  amLODipine (NORVASC) 5 MG tablet [id number] Yes Take 1 tablet (5 mg total) by mouth daily. Marin Olp, MD Taking Active   Ascorbic Acid (VITAMIN C) 1000 MG tablet [id number] Yes Take 3,000 mg by mouth daily.  [provider] Taking Active   aspirin EC 81 MG tablet [id number] Yes Take 81 mg by mouth daily. Swallow whole. [provider] Taking Active Self  betamethasone dipropionate 0.05 % cream [id number] No Apply topically 2 (two) times daily.  Patient not taking: Reported on 06/14/2020   Maximiano Coss, NP Not Taking Active   cyanocobalamin 500 MCG tablet [id number] Yes Take 500 mcg by mouth daily. [provider] Taking Active   doxylamine, Sleep, (UNISOM) 25 MG tablet [id number] Yes Take 25 mg by mouth at bedtime as needed. [provider] Taking Active   Glucosamine-Chondroit-Vit C-Mn (GLUCOSAMINE CHONDR [place]) CAPS [id number] Yes Take 3-4 capsules by mouth daily.  [provider] Taking Active   Multiple Vitamin (MULTIVITAMIN) tablet [id number] Yes Take 1 tablet by mouth daily.   [provider] Taking Active   multivitamin-lutein [facility]) CAPS [id number] Yes Take 1 capsule by mouth daily. [provider] Taking Active   Omega-3 Fatty Acids (OMEGA-3 PO) [id number] Yes Take 4 capsules by mouth  daily.  [provider] Taking Active   Saw Palmetto, Serenoa repens, 1000 MG CAPS [id number] Yes Take 2 capsules by mouth daily.  [provider] Taking Active          Patient Active Problem List   Diagnosis Date Noted   History of adenomatous polyp of colon 02/23/2018   History of hematuria 06/19/2016   History of melanoma 09/05/2014   BPH (benign prostatic hyperplasia) 09/05/2014   Essential hypertension 09/05/2014   Hyperlipidemia 09/05/2014   Bunion of great toe of left foot 10/20/2011   Immunization History  Administered Date(s) Administered   Fluad Quad(high Dose 65+) 06/14/2020  Influenza Whole 07/04/2009   Influenza, High Dose Seasonal PF 03/30/2019   Influenza,inj,Quad PF,6+ Mos 04/20/2013, 03/19/2016, 05/25/2017   Influenza-Unspecified 05/16/2014, 05/16/2015, 06/18/2018   PFIZER(Purple Top)SARS-COV-2 Vaccination 08/24/2019, 09/14/2019, 04/14/2020   Pneumococcal Conjugate-13 09/05/2014   Pneumococcal Polysaccharide-23 10/18/2012   Td 08/04/2005   Tdap 09/20/2015   Zoster 10/18/2012    Conditions to be addressed/monitored:  {USCCMDZASSESSMENTOPTIONS:23563} There are no care plans that you recently modified to display for this patient.   Medication Assistance: {MEDASSISTANCEINFO:25044} Patient's preferred pharmacy is:  [facility], [place] [address] Phone: [phone] Fax: [phone]  Uses pill box? {Yes or If no, why not?:20788}. Pt endorses ***% compliance. We discussed: {Pharmacy options:24294}. Patient decided to: {US Pharmacy YBOF:75102} Patient agrees to Care Plan and Follow-up***.  Current Barriers:   {pharmacybarriers:24917}  Pharmacist Clinical Goal(s):   Over the next *** days, patient will {PHARMACYGOALCHOICES:24921} through collaboration with PharmD and provider.   ***  Interventions:  1:1 collaboration with  Marin Olp, MD regarding development and update of comprehensive plan of care as evidenced by provider attestation and co-signature  Inter-disciplinary care team collaboration (see longitudinal plan of care)  Comprehensive medication review performed; medication list updated in electronic medical record  {CCM PHARMD DISEASE STATES:25130} Patient Goals/Self-Care Activities  Over the next *** days, patient will:  - {pharmacypatientgoals:24919}  Future Appointments  Date Time Provider [place]  09/25/2020  1:00 PM [facility] CCM PHARMACIST [facility]  12/13/2020  1:20 PM Marin Olp, MD [facility]   Follow-up plan with Care Management Team:  CPA: ***  RPH: *** visit  Madelin Rear, Pharm.D., BCGP Clinical Pharmacist [place] [phone]

## 2020-09-25 ENCOUNTER — Telehealth: Payer: Self-pay

## 2020-09-25 ENCOUNTER — Ambulatory Visit: Payer: Medicare Other

## 2020-09-25 NOTE — Progress Notes (Incomplete)
Chronic Care Management Pharmacy Note  09/24/2020 Name:  Thomas Craig MRN:  [id number] DOB:  03/07/1946  Subjective: QUANTRELL SPLITT is an 75 y.o. year old male who is a primary patient of Yong Channel, Brayton Mars, MD.  The CCM team was consulted for assistance with disease management and care coordination needs.   Engaged with patient by telephone for follow up visit in response to provider referral for pharmacy case management and/or care coordination services.   Consent to Services:  The patient was given information about Chronic Care Management services, agreed to services, and gave verbal consent prior to initiation of services.  Please see initial visit note for detailed documentation.  Patient Care Team: Marin Olp, MD as PCP - General (Family Medicine) Madelin Rear, [facility] as Pharmacist (Pharmacist)  Recent office visits: *** Recent consult visits: ***  Objective: Lab Results  Component Value Date   CREATININE 0.90 09/15/2019   BUN 20 09/15/2019   GFR 82.69 09/15/2019   GFRNONAA >60 11/22/2017   GFRAA >60 11/22/2017   NA 133 (L) 09/15/2019   K 4.6 09/15/2019   CALCIUM 10.0 09/15/2019   CO2 32 09/15/2019   Lab Results  Component Value Date/Time   GFR 82.69 09/15/2019 02:29 PM   GFR 69.41 09/15/2018 08:25 AM    Last diabetic Eye exam: No results found for: HMDIABEYEEXA  Last diabetic Foot exam: No results found for: HMDIABFOOTEX  Lab Results  Component Value Date   CHOL 221 (H) 09/15/2019   HDL 92.00 09/15/2019   LDLCALC 118 (H) 09/15/2019   LDLDIRECT 100.5 10/27/2011   TRIG 56.0 09/15/2019   CHOLHDL 2 09/15/2019   Hepatic Function Latest Ref Rng & Units 09/15/2019 09/15/2018 01/07/2018  Total Protein 6.0 - 8.3 g/dL 7.0 6.0 6.4  Albumin 3.5 - 5.2 g/dL 4.6 3.9 4.2  AST 0 - 37 U/L _0 ALT 0 - 53 U/L _1 Alk Phosphatase 39 - 117 U/L 33(L) 27(L) 31(L)  Total Bilirubin 0.2 - 1.2 mg/dL 0.9 0.8 0.9  Bilirubin, Direct 0.0 - 0.3 mg/dL - - -   Lab  Results  Component Value Date/Time   TSH 2.40 09/03/2015 08:05 AM   TSH 1.98 10/11/2012 08:41 AM   CBC Latest Ref Rng & Units 09/15/2019 09/15/2018 11/22/2017  WBC 4.0 - 10.5 K/uL 7.4 3.2(L) 7.7  Hemoglobin 13.0 - 17.0 g/dL 14.0 13.3 13.9  Hematocrit 39.0 - 52.0 % 40.6 38.5(L) 39.2  Platelets 150.0 - 400.0 K/uL 191.0 188.0 174   No results found for: VD25OH  Clinical ASCVD: {YES/NO:21197} The 10-year ASCVD risk score Mikey Bussing DC Jr., et al., 2013) is: 21%   Values used to calculate the score:     Age: 62 years     Sex: Male     Is Non-Hispanic African American: No     Diabetic: No     Tobacco smoker: No     Systolic Blood Pressure: 992 mmHg     Is BP treated: Yes     HDL Cholesterol: 92 mg/dL     Total Cholesterol: 221 mg/dL    CAC (10/2019)- 23rd percentile for age/sex matched control VAS US AORTA (09/2020) -Abdominal Aorta: No evidence of an abdominal aortic aneurysm was noted. visualized. Stenosis: No signifcant aorta-iliac atherosclerosis or stenosis noted. Suggest follow up study in 24 months due to iliac artery ectasia.   Depression screen [facility] 2/9 02/14/2020 06/10/2019 09/07/2018  Decreased Interest 0 0 0  Down, Depressed, Hopeless 0 0  0  PHQ - 2 Score 0 0 0  Altered sleeping - 0 0  Tired, decreased energy - 0 0  Change in appetite - 0 0  Feeling bad or failure about yourself  - 0 0  Trouble concentrating - 0 0  Moving slowly or fidgety/restless - 0 0  Suicidal thoughts - 0 0  PHQ-9 Score - 0 0  Difficult doing work/chores - Not difficult at all Not difficult at all    Social History   Tobacco Use  Smoking Status Former Smoker  . Packs/day: 0.75  . Years: 5.00  . Pack years: 3.75  . Types: Cigarettes  . Quit date: 08/05/1975  . Years since quitting: 45.1  Smokeless Tobacco Never Used  Tobacco Comment   ONLY SMOKED FOR 5 Y EARS or less with limited cigarettes   BP Readings from Last 3 Encounters:  06/14/20 122/80  02/14/20 (!) 178/82  09/15/19 138/82   Pulse  Readings from Last 3 Encounters:  06/14/20 79  02/14/20 84  09/15/19 89   Wt Readings from Last 3 Encounters:  06/14/20 145 lb 12.8 oz (66.1 kg)  02/14/20 150 lb 3.2 oz (68.1 kg)  09/15/19 151 lb 6.4 oz (68.7 kg)   Assessment/Interventions: Review of patient past medical history, allergies, medications, health status, including review of consultants reports, laboratory and other test data, was performed as part of comprehensive evaluation and provision of chronic care management services.   SDOH:  (Social Determinants of Health) assessments and interventions performed: {yes/no:20286}  CCM Care Plan Allergies  Allergen Reactions  . Sulfa Antibiotics    Medications Reviewed Today    Reviewed by Thomes Cake, CMA (Certified Medical Assistant) on 06/14/20 at 1350  Med List Status: <None>  Medication Order Taking? Sig Documenting Provider Last Dose Status Informant  amLODipine (NORVASC) 5 MG tablet [id number] Yes Take 1 tablet (5 mg total) by mouth daily. Marin Olp, MD Taking Active   Ascorbic Acid (VITAMIN C) 1000 MG tablet [id number] Yes Take 3,000 mg by mouth daily.  [provider] Taking Active   aspirin EC 81 MG tablet [id number] Yes Take 81 mg by mouth daily. Swallow whole. [provider] Taking Active Self  betamethasone dipropionate 0.05 % cream [id number] No Apply topically 2 (two) times daily.  Patient not taking: Reported on 06/14/2020   Maximiano Coss, NP Not Taking Active   cyanocobalamin 500 MCG tablet [id number] Yes Take 500 mcg by mouth daily. [provider] Taking Active   doxylamine, Sleep, (UNISOM) 25 MG tablet [id number] Yes Take 25 mg by mouth at bedtime as needed. [provider] Taking Active   Glucosamine-Chondroit-Vit C-Mn (GLUCOSAMINE CHONDR [place]) CAPS [id number] Yes Take 3-4 capsules by mouth daily.  [provider] Taking Active   Multiple Vitamin (MULTIVITAMIN) tablet [id number] Yes Take 1 tablet by mouth  daily.   [provider] Taking Active   multivitamin-lutein [facility]) CAPS [id number] Yes Take 1 capsule by mouth daily. [provider] Taking Active   Omega-3 Fatty Acids (OMEGA-3 PO) [id number] Yes Take 4 capsules by mouth daily.  [provider] Taking Active   Saw Palmetto, Serenoa repens, 1000 MG CAPS [id number] Yes Take 2 capsules by mouth daily.  [provider] Taking Active          Patient Active Problem List   Diagnosis Date Noted  . History of adenomatous polyp of colon 02/23/2018  . History of hematuria 06/19/2016  . History of melanoma 09/05/2014  . BPH (  benign prostatic hyperplasia) 09/05/2014  . Essential hypertension 09/05/2014  . Hyperlipidemia 09/05/2014  . Bunion of great toe of left foot 10/20/2011   Immunization History  Administered Date(s) Administered  . Fluad Quad(high Dose 65+) 06/14/2020  . Influenza Whole 07/04/2009  . Influenza, High Dose Seasonal PF 03/30/2019  . Influenza,inj,Quad PF,6+ Mos 04/20/2013, 03/19/2016, 05/25/2017  . Influenza-Unspecified 05/16/2014, 05/16/2015, 06/18/2018  . PFIZER(Purple Top)SARS-COV-2 Vaccination 08/24/2019, 09/14/2019, 04/14/2020  . Pneumococcal Conjugate-13 09/05/2014  . Pneumococcal Polysaccharide-23 10/18/2012  . Td 08/04/2005  . Tdap 09/20/2015  . Zoster 10/18/2012    Conditions to be addressed/monitored:  Hypertension and Hyperlipidemia There are no care plans that you recently modified to display for this patient.   Medication Assistance: {MEDASSISTANCEINFO:25044} Patient's preferred pharmacy is:  [place], [place] [address] Phone: [phone] Fax: [phone]  Uses pill box? {Yes or If no, why not?:20788}. Pt endorses ***% compliance. We discussed: {Pharmacy options:24294}. Patient decided to: {US Pharmacy APTC:05259} Patient agrees to Care Plan and  Follow-up***.  Current Barriers:  . {pharmacybarriers:24917}  Pharmacist Clinical Goal(s):  Marland Kitchen Over the next *** days, patient will {PHARMACYGOALCHOICES:24921} through collaboration with PharmD and provider.  . ***  Interventions: . 1:1 collaboration with Marin Olp, MD regarding development and update of comprehensive plan of care as evidenced by provider attestation and co-signature . Inter-disciplinary care team collaboration (see longitudinal plan of care) . Comprehensive medication review performed; medication list updated in electronic medical record  Hypertension (BP goal <130/80) -{CHL Controlled/Uncontrolled:857 198 3245} -Current treatment: . Amlodipine 5 mg once daily (03/2016) -Medications previously tried: none on file  -Current home readings: *** -Current dietary habits: *** -Current exercise habits: *** -{ACTIONS;DENIES/REPORTS:21021675::"Denies"} hypotensive/hypertensive symptoms -Educated on {CCM BP Counseling:25124} -Counseled to monitor BP at home ***, document, and provide log at future appointments -{CCMPHARMDINTERVENTION:25122}  Hyperlipidemia: (LDL goal < ***) -{CHL Controlled/Uncontrolled:857 198 3245} -former smoker x5 yrs. 2022 AAA negative.  -Current treatment: . *** -Medications previously tried: ***  -Current dietary patterns: *** -Current exercise habits: *** -Educated on {CCM HLD Counseling:25126} -{CCMPHARMDINTERVENTION:25122}  Depression/Anxiety (Goal: ***) -{CHL Controlled/Uncontrolled:857 198 3245} -Current treatment: . *** -Medications previously tried/failed: *** -PHQ9: *** -GAD7: *** -Connected with *** for mental health support -Educated on {CCM mental health counseling:25127} -{CCMPHARMDINTERVENTION:25122}  *** (Goal: ***) -{CHL Controlled/Uncontrolled:857 198 3245} -Current treatment  . *** -Medications previously tried: ***  -{CCMPHARMDINTERVENTION:25122}  Health Maintenance -Vaccine gaps: *** -Current therapy:   . *** -Educated on {ccm supplement counseling:25128} -{CCM Patient satisfied:25129} -{CCMPHARMDINTERVENTION:25122}  Patient Goals/Self-Care Activities . Over the next *** days, patient will:  - {pharmacypatientgoals:24919}  Future Appointments  Date Time Provider [place]  09/25/2020  1:00 PM [facility] CCM PHARMACIST [facility]  12/13/2020  1:20 PM Marin Olp, MD [facility]   Follow-up plan with Care Management Team: . CPA: *** . RPH: *** visit  Madelin Rear, Pharm.D., BCGP Clinical Pharmacist [place] [phone]

## 2020-09-25 NOTE — Chronic Care Management (AMB) (Signed)
Chronic Care Management Pharmacy Assistant   Name: Thomas Craig  MRN: [id number] DOB: Dec 15, 1945  Reason for Encounter: Disease State/ Hypertension Adherence Call  PCP : Marin Olp, MD  Allergies:   Allergies  Allergen Reactions  . Sulfa Antibiotics     Medications: Outpatient Encounter Medications as of 09/25/2020  Medication Sig  . amLODipine (NORVASC) 5 MG tablet Take 1 tablet (5 mg total) by mouth daily.  . Ascorbic Acid (VITAMIN C) 1000 MG tablet Take 3,000 mg by mouth daily.   Marland Kitchen aspirin EC 81 MG tablet Take 81 mg by mouth daily. Swallow whole.  . betamethasone dipropionate 0.05 % cream Apply topically 2 (two) times daily. (Patient not taking: Reported on 06/14/2020)  . cyanocobalamin 500 MCG tablet Take 500 mcg by mouth daily.  Marland Kitchen doxylamine, Sleep, (UNISOM) 25 MG tablet Take 25 mg by mouth at bedtime as needed.  . Glucosamine-Chondroit-Vit C-Mn (GLUCOSAMINE CHONDR 1500 COMPLX) CAPS Take 3-4 capsules by mouth daily.   . Multiple Vitamin (MULTIVITAMIN) tablet Take 1 tablet by mouth daily.    . multivitamin-lutein (OCUVITE-LUTEIN) CAPS Take 1 capsule by mouth daily.  . Omega-3 Fatty Acids (OMEGA-3 PO) Take 4 capsules by mouth daily.   . Saw Palmetto, Serenoa repens, 1000 MG CAPS Take 2 capsules by mouth daily.    No facility-administered encounter medications on file as of 09/25/2020.    Current Diagnosis: Patient Active Problem List   Diagnosis Date Noted  . History of adenomatous polyp of colon 02/23/2018  . History of hematuria 06/19/2016  . History of melanoma 09/05/2014  . BPH (benign prostatic hyperplasia) 09/05/2014  . Essential hypertension 09/05/2014  . Hyperlipidemia 09/05/2014  . Bunion of great toe of left foot 10/20/2011    Reviewed chart prior to disease state call. Spoke with patient regarding BP  Recent Office Vitals: BP Readings from Last 3 Encounters:  06/14/20 122/80  02/14/20 (!) 178/82  09/15/19 138/82   Pulse Readings from  Last 3 Encounters:  06/14/20 79  02/14/20 84  09/15/19 89    Wt Readings from Last 3 Encounters:  06/14/20 145 lb 12.8 oz (66.1 kg)  02/14/20 150 lb 3.2 oz (68.1 kg)  09/15/19 151 lb 6.4 oz (68.7 kg)     Kidney Function Lab Results  Component Value Date/Time   CREATININE 0.90 09/15/2019 02:29 PM   CREATININE 1.05 09/15/2018 08:25 AM   GFR 82.69 09/15/2019 02:29 PM   GFRNONAA >60 11/22/2017 04:57 PM   GFRAA >60 11/22/2017 04:57 PM    BMP Latest Ref Rng & Units 09/15/2019 09/15/2018 01/07/2018  Glucose 70 - 99 mg/dL 97 92 85  BUN 6 - 23 mg/dL 20 24(H) 20  Creatinine 0.40 - 1.50 mg/dL 0.90 1.05 0.85  Sodium 135 - 145 mEq/L 133(L) 133(L) 134(L)  Potassium 3.5 - 5.1 mEq/L 4.6 4.2 4.2  Chloride 96 - 112 mEq/L 96 96 99  CO2 19 - 32 mEq/L 32 25 28  Calcium 8.4 - 10.5 mg/dL 10.0 9.0 9.4    . Current antihypertensive regimen:  o Amlodipine 5 mg tablet daily  . How often are you checking your Blood Pressure? 1-2x per week   . Current home BP readings: 122/80  . What recent interventions/DTPs have been made by any provider to improve Blood Pressure control since last CPP Visit: Patient is currently taking his medications as prescribed.  . Any recent hospitalizations or ED visits since last visit with CPP? No , patient has not had any hospitalizations or  ED visits since his last visit with Madelin Rear, CPP.  Marland Kitchen What diet changes have been made to improve Blood Pressure Control?  o Patient states he does not follow any specific diet. Patient states he does watch his salt/sodium intake.  . What exercise is being done to improve your Blood Pressure Control?  o Patient states he runs 4 miles 4-5 times a week. Patient states he works out with a Clinical research associate 2 x/week for 30 minutes. Patient also states he plays Tennis once a week.  Adherence Review: Is the patient currently on ACE/ARB medication? No Does the patient have >5 day gap between last estimated fill dates? No  Patient states he  broke his "baby finger" during a fall and saw Dr. Percell Miller (orthopedic surgeon) on 08/10/2020.  Patient scheduled his follow-up telephone visit with Madelin Rear, CPP on Tuesday 10/02/2020 at 11:00 am.  April D Calhoun, [place] Pharmacist Assistant [phone]   Follow-Up:  Pharmacist Review

## 2020-09-25 NOTE — Progress Notes (Signed)
  Chronic Care Management   Outreach Note   Name: Thomas Craig MRN: [id number] DOB: 20-Feb-1946  Referred by: Marin Olp, MD Reason for referral: Telephone Appointment with [place] Pharmacist, Madelin Rear.   An unsuccessful telephone outreach was attempted today. The patient was referred to the pharmacist for assistance with care management and care coordination.   Telephone appointment with clinical pharmacist today (09/25/2020) at 1pm. If patient immediately returns call, transfer to [phone]. Otherwise, please provide this number so patient can reschedule visit.   Madelin Rear, Pharm.D., BCGP Clinical Pharmacist [facility] [phone]

## 2020-10-02 ENCOUNTER — Ambulatory Visit (INDEPENDENT_AMBULATORY_CARE_PROVIDER_SITE_OTHER): Payer: Medicare Other

## 2020-10-02 DIAGNOSIS — I10 Essential (primary) hypertension: Secondary | ICD-10-CM

## 2020-10-02 DIAGNOSIS — E785 Hyperlipidemia, unspecified: Secondary | ICD-10-CM

## 2020-10-02 NOTE — Patient Instructions (Addendum)
Thomas Craig,  Thank you for taking the time to review your medications with me today.  I have included our care Craig/goals in the following pages. Please review and call me at [phone] with any questions!  Thanks! Ellin Mayhew, Pharm.D., BCGP Clinical Pharmacist [facility] [place] [phone] Patient Care Craig: Thomas Craig    Problem Identified: HLD HTN     Long-Range Goal: Disease management   Start Date: 10/02/2020  Expected End Date: 10/02/2021  This Visit's Progress: On track  Priority: High  Note:     Medication Assistance: None required.  Patient affirms current coverage meets needs. Patient's preferred pharmacy is:  [place], [place] [address] Phone: [phone] Fax: [phone]  Patient decided to: Continue current medication management strategy Patient agrees to Care Craig and Follow-up.  Current Barriers:  . suboptimal control of cholesterol  Pharmacist Clinical Goal(s):  Marland Kitchen Over the next 365 days, patient will verbalize ability to afford treatment regimen . achieve adherence to monitoring guidelines and medication adherence to achieve therapeutic efficacy . contact provider office for questions/concerns as evidenced notation of same in electronic health record through collaboration with PharmD and provider.   Interventions: . 1:1 collaboration with Marin Olp, MD regarding development and update of comprehensive Craig of care as evidenced by provider attestation and co-signature . Inter-disciplinary care team collaboration (see longitudinal Craig of care) . Comprehensive medication review performed; medication list updated in electronic medical record  Hypertension (BP goal <130/80) -Controlled  -Current treatment: . Amlodipine 5 mg once daily -Current home readings: at/near goal at  home -Current dietary habits: cooks 2-4 meals per week. Fish or chicken, white meat, all fresh vegetables/fruits. Fruits are main snack. -Current exercise habits: 20-25 miles per week. -Denies hypotensive/hypertensive symptoms -Educated on BP goals and benefits of medications for prevention of heart attack, stroke and kidney damage; Daily salt intake goal < 2300 mg; Exercise goal of 150 minutes per week; -Counseled to monitor BP at home, document, and provide log at future appointments -Counseled on diet and exercise extensively  Hyperlipidemia: (LDL goal <100 without medications) -Not ideally controlled -Current treatment: . N/a at this time -ASA for colon (hx of polyps)/CV health -AAA/CAD screenings lower risk, done within last 2 years -Despite ASCVD 10-year risk >20% feels he would be hesitant to start statin due to consistently low total/hdl cholesterol ratio. -Current dietary patterns: see HTN -Current exercise habits: see HTN -Educated on Cholesterol goals;  Benefits of statin for ASCVD risk reduction; Importance of limiting foods high in cholesterol; Exercise goal of 150 minutes per week; -Counseled on diet and exercise extensively -RPH to Review ASCVD risk and statin use at 6 month telephone visit  Patient Goals/Self-Care Activities . Over the next 365 days, patient will:  - take medications as prescribed target a minimum of 150 minutes of moderate intensity exercise weekly      The patient verbalized understanding of instructions provided today and agreed to receive a MyChart copy of patient instruction and/or educational materials. Telephone follow up appointment with pharmacy team member scheduled for: See next appointment with "Care Management Staff" under "What's Next" below.

## 2020-10-02 NOTE — Progress Notes (Signed)
Chronic Care Management Pharmacy Note  10/02/2020 Name:  Thomas Craig MRN:  [id number] DOB:  [birth year]  Subjective: Thomas Craig is an 75 y.o. year old male who is a primary patient of Hunter, Brayton Mars, MD.  The CCM team was consulted for assistance with disease management and care coordination needs.   Engaged with patient by telephone for follow up visit in response to provider referral for pharmacy case management and/or care coordination services.   Consent to Services:  The patient was given information about Chronic Care Management services, agreed to services, and gave verbal consent prior to initiation of services.  Please see initial visit note for detailed documentation.  Patient Care Team: Marin Olp, MD as PCP - General (Family Medicine) Madelin Rear, [facility] as Pharmacist (Pharmacist)  Objective: Lab Results  Component Value Date   CREATININE 0.90 09/15/2019   BUN 20 09/15/2019   GFR 82.69 09/15/2019   GFRNONAA >60 11/22/2017   GFRAA >60 11/22/2017   NA 133 (L) 09/15/2019   K 4.6 09/15/2019   CALCIUM 10.0 09/15/2019   CO2 32 09/15/2019   Lab Results  Component Value Date/Time   GFR 82.69 09/15/2019 02:29 PM   GFR 69.41 09/15/2018 08:25 AM    Last diabetic Eye exam: No results found for: HMDIABEYEEXA  Last diabetic Foot exam: No results found for: HMDIABFOOTEX  Lab Results  Component Value Date   CHOL 221 (H) 09/15/2019   HDL 92.00 09/15/2019   LDLCALC 118 (H) 09/15/2019   LDLDIRECT 100.5 10/27/2011   TRIG 56.0 09/15/2019   CHOLHDL 2 09/15/2019   Hepatic Function Latest Ref Rng & Units 09/15/2019 09/15/2018 01/07/2018  Total Protein 6.0 - 8.3 g/dL 7.0 6.0 6.4  Albumin 3.5 - 5.2 g/dL 4.6 3.9 4.2  AST 0 - 37 U/L $Remo'27 30 27  'dyNkQ$ ALT 0 - 53 U/L $Remo'22 25 28  'UPmsz$ Alk Phosphatase 39 - 117 U/L 33(L) 27(L) 31(L)  Total Bilirubin 0.2 - 1.2 mg/dL 0.9 0.8 0.9  Bilirubin, Direct 0.0 - 0.3 mg/dL - - -   Lab Results  Component Value Date/Time   TSH 2.40 09/03/2015  08:05 AM   TSH 1.98 10/11/2012 08:41 AM   CBC Latest Ref Rng & Units 09/15/2019 09/15/2018 11/22/2017  WBC 4.0 - 10.5 K/uL 7.4 3.2(L) 7.7  Hemoglobin 13.0 - 17.0 g/dL 14.0 13.3 13.9  Hematocrit 39.0 - 52.0 % 40.6 38.5(L) 39.2  Platelets 150.0 - 400.0 K/uL 191.0 188.0 174   No results found for: VD25OH  Clinical ASCVD: No  The 10-year ASCVD risk score Mikey Bussing DC Jr., et al., 2013) is: 21%   Values used to calculate the score:     Age: 43 years     Sex: Male     Is Non-Hispanic African American: No     Diabetic: No     Tobacco smoker: No     Systolic Blood Pressure: 650 mmHg     Is BP treated: Yes     HDL Cholesterol: 92 mg/dL     Total Cholesterol: 221 mg/dL    CAC 10/2019: 32 (23rd percentile for age/sex matched control)  Aorta US 09/2020: Abdominal Aorta: No evidence of an abdominal aortic aneurysm was visualized. The largest aortic measurement is 2.8 cm. The bilateral common iliac arteries appear ectatic, with the right measuring 1.7 cm and the left measuring 1.8 cm. Stenosis: No signifcant aorta-iliac atherosclerosis or stenosis noted.   Depression screen [facility] 2/9 02/14/2020 06/10/2019 09/07/2018  Decreased Interest 0 0 0  Down, Depressed, Hopeless 0 0 0  PHQ - 2 Score 0 0 0  Altered sleeping - 0 0  Tired, decreased energy - 0 0  Change in appetite - 0 0  Feeling bad or failure about yourself  - 0 0  Trouble concentrating - 0 0  Moving slowly or fidgety/restless - 0 0  Suicidal thoughts - 0 0  PHQ-9 Score - 0 0  Difficult doing work/chores - Not difficult at all Not difficult at all    Social History   Tobacco Use  Smoking Status Former Smoker  . Packs/day: 0.75  . Years: 5.00  . Pack years: 3.75  . Types: Cigarettes  . Quit date: 08/05/1975  . Years since quitting: 45.1  Smokeless Tobacco Never Used  Tobacco Comment   ONLY SMOKED FOR 5 Y EARS or less with limited cigarettes   BP Readings from Last 3 Encounters:  06/14/20 122/80  02/14/20 (!) 178/82  09/15/19 138/82    Pulse Readings from Last 3 Encounters:  06/14/20 79  02/14/20 84  09/15/19 89   Wt Readings from Last 3 Encounters:  06/14/20 145 lb 12.8 oz (66.1 kg)  02/14/20 150 lb 3.2 oz (68.1 kg)  09/15/19 151 lb 6.4 oz (68.7 kg)   Assessment/Interventions: Review of patient past medical history, allergies, medications, health status, including review of consultants reports, laboratory and other test data, was performed as part of comprehensive evaluation and provision of chronic care management services.   SDOH:  (Social Determinants of Health) assessments and interventions performed: Yes  CCM Care Plan Allergies  Allergen Reactions  . Sulfa Antibiotics    Medications Reviewed Today    Reviewed by Madelin Rear, [facility] (Pharmacist) on 10/02/20 at 1136  Med List Status: <None>  Medication Order Taking? Sig Documenting Provider Last Dose Status Informant  amLODipine (NORVASC) 5 MG tablet [id number] Yes Take 1 tablet (5 mg total) by mouth daily. Marin Olp, MD Taking Active   Ascorbic Acid (VITAMIN C) 1000 MG tablet [id number] Yes Take 3,000 mg by mouth daily.  [provider] Taking Active   aspirin EC 81 MG tablet [id number] Yes Take 81 mg by mouth daily. Swallow whole. [provider] Taking Active Self  cyanocobalamin 500 MCG tablet [id number] Yes Take 500 mcg by mouth daily. [provider] Taking Active   doxylamine, Sleep, (UNISOM) 25 MG tablet [id number] Yes Take 25 mg by mouth at bedtime as needed. [provider] Taking Active   Glucosamine-Chondroit-Vit C-Mn (GLUCOSAMINE CHONDR [place]) CAPS [id number] Yes Take 3-4 capsules by mouth daily.  [provider] Taking Active   Multiple Vitamin (MULTIVITAMIN) tablet [id number] Yes Take 1 tablet by mouth daily. Contains iron [provider] Taking Active   multivitamin-lutein [facility]) CAPS [id number] Yes Take 1 capsule by mouth daily. [provider] Taking Active   Omega-3  Fatty Acids (OMEGA-3 PO) [id number] Yes Take 4 capsules by mouth daily. [provider] Taking Active   Saw Palmetto, Serenoa repens, 1000 MG CAPS [id number] Yes Take 2 capsules by mouth daily. [provider] Taking Active          Patient Active Problem List   Diagnosis Date Noted  . History of adenomatous polyp of colon 02/23/2018  . History of hematuria 06/19/2016  . History of melanoma 09/05/2014  . BPH (benign prostatic hyperplasia) 09/05/2014  . Essential hypertension 09/05/2014  . Hyperlipidemia 09/05/2014  . Bunion of great toe of left foot 10/20/2011   Immunization History  Administered Date(s)  Administered  . Fluad Quad(high Dose 65+) 06/14/2020  . Influenza Whole 07/04/2009  . Influenza, High Dose Seasonal PF 03/30/2019  . Influenza,inj,Quad PF,6+ Mos 04/20/2013, 03/19/2016, 05/25/2017  . Influenza-Unspecified 05/16/2014, 05/16/2015, 06/18/2018  . PFIZER(Purple Top)SARS-COV-2 Vaccination 08/24/2019, 09/14/2019, 04/14/2020  . Pneumococcal Conjugate-13 09/05/2014  . Pneumococcal Polysaccharide-23 10/18/2012  . Td 08/04/2005  . Tdap 09/20/2015  . Zoster 10/18/2012   Conditions to be addressed/monitored:  Hypertension and Hyperlipidemia Care Plan : [place]  Updates made by Madelin Rear, [facility] since 10/02/2020 12:00 AM    Problem: HLD HTN     Long-Range Goal: Disease management   Start Date: 10/02/2020  Expected End Date: 10/02/2021  This Visit's Progress: On track  Priority: High  Note:     Medication Assistance: None required.  Patient affirms current coverage meets needs. Patient's preferred pharmacy is:  [place], [place] [address] Phone: [phone] Fax: [phone]  Patient decided to: Continue current medication management strategy Patient agrees to Care Plan and Follow-up.  Current Barriers:  . suboptimal control of  cholesterol  Pharmacist Clinical Goal(s):  Marland Kitchen Over the next 365 days, patient will verbalize ability to afford treatment regimen . achieve adherence to monitoring guidelines and medication adherence to achieve therapeutic efficacy . contact provider office for questions/concerns as evidenced notation of same in electronic health record through collaboration with PharmD and provider.   Interventions: . 1:1 collaboration with Marin Olp, MD regarding development and update of comprehensive plan of care as evidenced by provider attestation and co-signature . Inter-disciplinary care team collaboration (see longitudinal plan of care) . Comprehensive medication review performed; medication list updated in electronic medical record  Hypertension (BP goal <130/80) -Controlled  -Current treatment: . Amlodipine 5 mg once daily -Current home readings: at/near goal at home -Current dietary habits: cooks 2-4 meals per week. Fish or chicken, white meat, all fresh vegetables/fruits. Fruits are main snack. -Current exercise habits: 20-25 miles per week. -Denies hypotensive/hypertensive symptoms -Educated on BP goals and benefits of medications for prevention of heart attack, stroke and kidney damage; Daily salt intake goal < 2300 mg; Exercise goal of 150 minutes per week; -Counseled to monitor BP at home, document, and provide log at future appointments -Counseled on diet and exercise extensively  Hyperlipidemia: (LDL goal <100 without medications) -Not ideally controlled -Current treatment: . N/a at this time -ASA for colon (hx of polyps)/CV health -AAA/CAD screenings done within last 2 years, despite ASCVD feels he would be hesitant to start statin due to low total/hdl cholesterol ratio. -Current dietary patterns: see HTN -Current exercise habits: see HTN -Educated on Cholesterol goals;  Benefits of statin for ASCVD risk reduction; Importance of limiting foods high in  cholesterol; Exercise goal of 150 minutes per week; -Counseled on diet and exercise extensively  Patient Goals/Self-Care Activities . Over the next 365 days, patient will:  - take medications as prescribed target a minimum of 150 minutes of moderate intensity exercise weekly     Future Appointments  Date Time Provider [place]  12/13/2020  1:20 PM Marin Olp, MD [facility]  04/16/2021 11:00 AM [facility] CCM PHARMACIST [facility]   Follow-up plan with Care Management Team: . CPA: July BP call . RPH: 6 month telephone visit  Madelin Rear, Pharm.D., BCGP Clinical Pharmacist [place] [phone]

## 2020-11-02 DIAGNOSIS — D485 Neoplasm of uncertain behavior of skin: Secondary | ICD-10-CM | POA: Diagnosis not present

## 2020-11-02 DIAGNOSIS — C44212 Basal cell carcinoma of skin of right ear and external auricular canal: Secondary | ICD-10-CM | POA: Diagnosis not present

## 2020-12-12 NOTE — Patient Instructions (Addendum)
Goal <135/85 on average on home blood pressures. Slightly higher than usual today in office but overall ok.   Please stop by lab before you go If you have mychart- we will send your results within 3 business days of [place] receiving them.  If you do not have mychart- we will call you about results within 5 business days of [place] receiving them.  *please also note that you will see labs on mychart as soon as they post. I will later go in and write notes on them- will say "notes from Dr. Yong Channel"  Recommended follow up: Return in about 6 months (around 06/15/2021) for follow up- or sooner if needed.

## 2020-12-13 ENCOUNTER — Encounter: Payer: Self-pay | Admitting: Family Medicine

## 2020-12-13 ENCOUNTER — Other Ambulatory Visit: Payer: Self-pay

## 2020-12-13 ENCOUNTER — Ambulatory Visit (INDEPENDENT_AMBULATORY_CARE_PROVIDER_SITE_OTHER): Payer: Medicare Other | Admitting: Family Medicine

## 2020-12-13 VITALS — BP 138/82 | HR 83 | Temp 98.1°F | Ht 66.0 in | Wt 145.8 lb

## 2020-12-13 DIAGNOSIS — N401 Enlarged prostate with lower urinary tract symptoms: Secondary | ICD-10-CM | POA: Diagnosis not present

## 2020-12-13 DIAGNOSIS — E785 Hyperlipidemia, unspecified: Secondary | ICD-10-CM

## 2020-12-13 DIAGNOSIS — R351 Nocturia: Secondary | ICD-10-CM

## 2020-12-13 DIAGNOSIS — I10 Essential (primary) hypertension: Secondary | ICD-10-CM

## 2020-12-13 LAB — LIPID PANEL
Cholesterol: 237 mg/dL — ABNORMAL HIGH (ref 0–200)
HDL: 105 mg/dL (ref >39.00)
LDL Cholesterol: 118 mg/dL — ABNORMAL HIGH (ref 0–99)
NonHDL: 132.14
Total CHOL/HDL Ratio: 2
Triglycerides: 71 mg/dL (ref 0.0–149.0)
VLDL: 14.2 mg/dL (ref 0.0–40.0)

## 2020-12-13 LAB — CBC WITH DIFFERENTIAL/PLATELET
Basophils Absolute: 0 10*3/uL (ref 0.0–0.1)
Basophils Relative: 0.6 % (ref 0.0–3.0)
Eosinophils Absolute: 0 10*3/uL (ref 0.0–0.7)
Eosinophils Relative: 0.8 % (ref 0.0–5.0)
HCT: 39.6 % (ref 39.0–52.0)
Hemoglobin: 14 g/dL (ref 13.0–17.0)
Lymphocytes Relative: 25.6 % (ref 12.0–46.0)
Lymphs Abs: 1.5 10*3/uL (ref 0.7–4.0)
MCHC: 35.4 g/dL (ref 30.0–36.0)
MCV: 92.5 fl (ref 78.0–100.0)
Monocytes Absolute: 0.4 10*3/uL (ref 0.1–1.0)
Monocytes Relative: 6.5 % (ref 3.0–12.0)
Neutro Abs: 3.9 10*3/uL (ref 1.4–7.7)
Neutrophils Relative %: 66.5 % (ref 43.0–77.0)
Platelets: 190 10*3/uL (ref 150.0–400.0)
RBC: 4.28 Mil/uL (ref 4.22–5.81)
RDW: 13.2 % (ref 11.5–15.5)
WBC: 5.8 10*3/uL (ref 4.0–10.5)

## 2020-12-13 LAB — COMPREHENSIVE METABOLIC PANEL
ALT: 25 U/L (ref 0–53)
AST: 31 U/L (ref 0–37)
Albumin: 4.6 g/dL (ref 3.5–5.2)
Alkaline Phosphatase: 29 U/L — ABNORMAL LOW (ref 39–117)
BUN: 18 mg/dL (ref 6–23)
CO2: 30 mEq/L (ref 19–32)
Calcium: 9.6 mg/dL (ref 8.4–10.5)
Chloride: 94 mEq/L — ABNORMAL LOW (ref 96–112)
Creatinine, Ser: 0.95 mg/dL (ref 0.40–1.50)
GFR: 78.93 mL/min (ref >60.00)
Glucose, Bld: 85 mg/dL (ref 70–99)
Potassium: 3.9 mEq/L (ref 3.5–5.1)
Sodium: 131 mEq/L — ABNORMAL LOW (ref 135–145)
Total Bilirubin: 0.8 mg/dL (ref 0.2–1.2)
Total Protein: 6.9 g/dL (ref 6.0–8.3)

## 2020-12-13 LAB — PSA: PSA: 2.68 ng/mL (ref 0.10–4.00)

## 2020-12-13 NOTE — Progress Notes (Signed)
Phone [phone] In person visit   Subjective:   Thomas Craig is a 75 y.o. year old very pleasant male patient who presents for/with See problem oriented charting Chief Complaint  Patient presents with  . Hypertension  . Hyperlipidemia   This visit occurred during the SARS-CoV-2 public health emergency.  Safety protocols were in place, including screening questions prior to the visit, additional usage of staff PPE, and extensive cleaning of exam room while observing appropriate contact time as indicated for disinfecting solutions.   Past Medical History-  Patient Active Problem List   Diagnosis Date Noted  . History of melanoma 09/05/2014    Priority: High  . History of adenomatous polyp of colon 02/23/2018    Priority: Medium  . BPH (benign prostatic hyperplasia) 09/05/2014    Priority: Medium  . Essential hypertension 09/05/2014    Priority: Medium  . Hyperlipidemia 09/05/2014    Priority: Medium  . History of hematuria 06/19/2016    Priority: Low  . Bunion of great toe of left foot 10/20/2011    Priority: Low    Medications- reviewed and updated Current Outpatient Medications  Medication Sig Dispense Refill  . amLODipine (NORVASC) 5 MG tablet Take 1 tablet (5 mg total) by mouth daily. 90 tablet 3  . Ascorbic Acid (VITAMIN C) 1000 MG tablet Take 3,000 mg by mouth daily.     Marland Kitchen aspirin EC 81 MG tablet Take 81 mg by mouth daily. Swallow whole.    . cyanocobalamin 500 MCG tablet Take 500 mcg by mouth daily.    Marland Kitchen doxylamine, Sleep, (UNISOM) 25 MG tablet Take 25 mg by mouth at bedtime as needed.    . Glucosamine-Chondroit-Vit C-Mn (GLUCOSAMINE CHONDR 1500 COMPLX) CAPS Take 3-4 capsules by mouth daily.     . Multiple Vitamin (MULTIVITAMIN) tablet Take 1 tablet by mouth daily. Contains iron    . multivitamin-lutein (OCUVITE-LUTEIN) CAPS Take 1 capsule by mouth daily.    . Omega-3 Fatty Acids (OMEGA-3 PO) Take 4 capsules by mouth daily.    . Saw Palmetto, Serenoa  repens, 1000 MG CAPS Take 2 capsules by mouth daily.     No current facility-administered medications for this visit.     Objective:  BP 138/82   Pulse 83   Temp 98.1 F (36.7 C) (Temporal)   Ht 5\' 6"  (1.676 m)   Wt 145 lb 12.8 oz (66.1 kg)   SpO2 97%   BMI 23.53 kg/m  Gen: NAD, resting comfortably CV: RRR no murmurs rubs or gallops, no carotid bruit Lungs: CTAB no crackles, wheeze, rhonchi Abdomen: soft/nontender/nondistended/normal bowel sounds. No rebound or guarding.  Ext: no edema Skin: warm, dry,      Assessment and Plan   #hypertension S: medication: Amlodipine 5 mg Home readings #s: Average 129-130s/69 consistently recent blood pressure 120/68  BP Readings from Last 3 Encounters:  12/13/20 138/82  06/14/20 122/80  02/14/20 (!) 178/82  -Patient continues to run 25 miles a week A/P: Blood pressure high normal but reasonably well controlled on repeat.  Home numbers look excellent.  Continue current medication.  Recommended goal is 135/85 on average for home blood pressure    #hyperlipidemia S: Medication:Aspirin 81 mg, omega-3 - Mild elevations on coronary calcium score of 32-which was 23rd percentile for age. Lab Results  Component Value Date   CHOL 221 (H) 09/15/2019   HDL 92.00 09/15/2019   LDLCALC 118 (H) 09/15/2019   LDLDIRECT 100.5 10/27/2011   TRIG 56.0 09/15/2019   CHOLHDL 2  09/15/2019  A/P: Reasonably well-controlled on last check factoring in overall low coronary calcium score-we discussed statin is certainly an option but he strongly prefers to remain off of statin  #History of melanoma- follows with dermatology annually-Recent Basal cell carcinoma on his right ear, he is undergoing a Mohs surgery soon  #History of adenomatous polyp of the colon-02/10/2018 with 3-year repeat planned.  He should receive a letter in the next few months  #Prostate cancer screening-patient prefers to continue to monitor PSA with BPH with nocturia about twice a night.   Update PSA with less than 50   Recommended follow up: Return in about 6 months (around 06/15/2021) for follow up- or sooner if needed. Future Appointments  Date Time Provider [place]  04/16/2021 11:00 AM [facility] CCM PHARMACIST [facility]    Lab/Order associations:   ICD-10-CM   1. Essential hypertension  I10 CBC with Differential/Platelet    Comprehensive metabolic panel    Lipid panel  2. Hyperlipidemia, unspecified hyperlipidemia type  E78.5 CBC with Differential/Platelet    Comprehensive metabolic panel    Lipid panel  3. Benign prostatic hyperplasia with nocturia  N40.1 PSA   R35.1     I,Alexis Bryant,acting as a scribe for Garret Reddish, MD.,have documented all relevant documentation on the behalf of Garret Reddish, MD,as directed by  Garret Reddish, MD while in the presence of Garret Reddish, MD.   I, Garret Reddish, MD, have reviewed all documentation for this visit. The documentation on 12/13/20 for the exam, diagnosis, procedures, and orders are all accurate and complete.  Return precautions advised.   Garret Reddish, MD

## 2020-12-14 ENCOUNTER — Other Ambulatory Visit: Payer: Self-pay

## 2020-12-14 DIAGNOSIS — R972 Elevated prostate specific antigen [PSA]: Secondary | ICD-10-CM

## 2020-12-25 DIAGNOSIS — C44212 Basal cell carcinoma of skin of right ear and external auricular canal: Secondary | ICD-10-CM | POA: Diagnosis not present

## 2021-01-10 ENCOUNTER — Encounter: Payer: Self-pay | Admitting: Family Medicine

## 2021-01-15 DIAGNOSIS — Z23 Encounter for immunization: Secondary | ICD-10-CM | POA: Diagnosis not present

## 2021-01-28 ENCOUNTER — Encounter: Payer: Self-pay | Admitting: Family Medicine

## 2021-01-28 NOTE — Telephone Encounter (Signed)
Dr. Hunter, please see message and advise. 

## 2021-01-29 ENCOUNTER — Encounter: Payer: Self-pay | Admitting: Family Medicine

## 2021-02-01 ENCOUNTER — Other Ambulatory Visit (HOSPITAL_COMMUNITY): Payer: Medicare Other

## 2021-02-12 ENCOUNTER — Other Ambulatory Visit: Payer: Medicare Other

## 2021-02-13 ENCOUNTER — Other Ambulatory Visit: Payer: Medicare Other

## 2021-02-14 ENCOUNTER — Other Ambulatory Visit (INDEPENDENT_AMBULATORY_CARE_PROVIDER_SITE_OTHER): Payer: Medicare Other

## 2021-02-14 ENCOUNTER — Encounter: Payer: Self-pay | Admitting: Family Medicine

## 2021-02-14 ENCOUNTER — Other Ambulatory Visit: Payer: Self-pay

## 2021-02-14 DIAGNOSIS — R972 Elevated prostate specific antigen [PSA]: Secondary | ICD-10-CM

## 2021-02-14 LAB — PSA: PSA: 1.81 ng/mL (ref 0.10–4.00)

## 2021-02-15 ENCOUNTER — Telehealth: Payer: Self-pay | Admitting: Pharmacist

## 2021-02-15 NOTE — Chronic Care Management (AMB) (Addendum)
Chronic Care Management Pharmacy Assistant   Name: Thomas Craig  MRN: [id number] DOB: 12/28/1945   Reason for Encounter:Hypertension Disease State call   Recent office visits:  12/13/20-Stephen Yong Channel MD (PCP)- Office visit for management of chronic conditions. No medication changes. Follow up in 6 months.   Recent consult visits:  None  Hospital visits:  None in previous 6 months  Medications: Outpatient Encounter Medications as of 02/15/2021  Medication Sig   amLODipine (NORVASC) 5 MG tablet Take 1 tablet (5 mg total) by mouth daily.   Ascorbic Acid (VITAMIN C) 1000 MG tablet Take 3,000 mg by mouth daily.    aspirin EC 81 MG tablet Take 81 mg by mouth daily. Swallow whole.   cyanocobalamin 500 MCG tablet Take 500 mcg by mouth daily.   doxylamine, Sleep, (UNISOM) 25 MG tablet Take 25 mg by mouth at bedtime as needed.   Glucosamine-Chondroit-Vit C-Mn (GLUCOSAMINE CHONDR 1500 COMPLX) CAPS Take 3-4 capsules by mouth daily.    Multiple Vitamin (MULTIVITAMIN) tablet Take 1 tablet by mouth daily. Contains iron   multivitamin-lutein (OCUVITE-LUTEIN) CAPS Take 1 capsule by mouth daily.   Omega-3 Fatty Acids (OMEGA-3 PO) Take 4 capsules by mouth daily.   Saw Palmetto, Serenoa repens, 1000 MG CAPS Take 2 capsules by mouth daily.   No facility-administered encounter medications on file as of 02/15/2021.   Reviewed chart prior to disease state call. Spoke with patient regarding BP  Recent Office Vitals: BP Readings from Last 3 Encounters:  12/13/20 138/82  06/14/20 122/80  02/14/20 (!) 178/82   Pulse Readings from Last 3 Encounters:  12/13/20 83  06/14/20 79  02/14/20 84    Wt Readings from Last 3 Encounters:  12/13/20 145 lb 12.8 oz (66.1 kg)  06/14/20 145 lb 12.8 oz (66.1 kg)  02/14/20 150 lb 3.2 oz (68.1 kg)     Kidney Function Lab Results  Component Value Date/Time   CREATININE 0.95 12/13/2020 02:46 PM   CREATININE 0.90 09/15/2019 02:29 PM   GFR 78.93  12/13/2020 02:46 PM   GFRNONAA >60 11/22/2017 04:57 PM   GFRAA >60 11/22/2017 04:57 PM    BMP Latest Ref Rng & Units 12/13/2020 09/15/2019 09/15/2018  Glucose 70 - 99 mg/dL 85 97 92  BUN 6 - 23 mg/dL 18 20 24(H)  Creatinine 0.40 - 1.50 mg/dL 0.95 0.90 1.05  Sodium 135 - 145 mEq/L 131(L) 133(L) 133(L)  Potassium 3.5 - 5.1 mEq/L 3.9 4.6 4.2  Chloride 96 - 112 mEq/L 94(L) 96 96  CO2 19 - 32 mEq/L 30 32 25  Calcium 8.4 - 10.5 mg/dL 9.6 10.0 9.0   Patient reported he recently had basal cell skin cancer on the back of his ear in April of this year. It was removed in May with clear margins. Patient denies problems obtaining his medications at the pharmacy.  He will call if problems arise. [place] is [place] to the beach in [place] weeks.   Current antihypertensive regimen:  Amlodipine 5 mg once daily How often are you checking your Blood Pressure? 1-2x per week Current home BP readings: 129/68 average.  131/69 a few days What recent interventions/DTPs have been made by any provider to improve Blood Pressure control since last CPP Visit: None Any recent hospitalizations or ED visits since last visit with CPP? No What diet changes have been made to improve Blood Pressure Control?  Patient stated his diet is stable.  What exercise is being done to improve your Blood Pressure Control?  Patient continues to run 25 miles a week  Adherence Review: Is the patient currently on ACE/ARB medication? No Does the patient have >5 day gap between last estimated fill dates? No Amlodipine 5mg   Last filled 12/14/20  90 DS  Star Rating Drugs: None  Glynda Reaves [place]   5 minutes spent in review, coordination, and documentation.  Reviewed by: Beverly Milch, PharmD Clinical Pharmacist [phone]

## 2021-04-09 DIAGNOSIS — Z08 Encounter for follow-up examination after completed treatment for malignant neoplasm: Secondary | ICD-10-CM | POA: Diagnosis not present

## 2021-04-09 DIAGNOSIS — L819 Disorder of pigmentation, unspecified: Secondary | ICD-10-CM | POA: Diagnosis not present

## 2021-04-09 DIAGNOSIS — L821 Other seborrheic keratosis: Secondary | ICD-10-CM | POA: Diagnosis not present

## 2021-04-09 DIAGNOSIS — Z85828 Personal history of other malignant neoplasm of skin: Secondary | ICD-10-CM | POA: Diagnosis not present

## 2021-04-09 DIAGNOSIS — L814 Other melanin hyperpigmentation: Secondary | ICD-10-CM | POA: Diagnosis not present

## 2021-04-09 DIAGNOSIS — S90862A Insect bite (nonvenomous), left foot, initial encounter: Secondary | ICD-10-CM | POA: Diagnosis not present

## 2021-04-09 DIAGNOSIS — D225 Melanocytic nevi of trunk: Secondary | ICD-10-CM | POA: Diagnosis not present

## 2021-04-09 DIAGNOSIS — S90562A Insect bite (nonvenomous), left ankle, initial encounter: Secondary | ICD-10-CM | POA: Diagnosis not present

## 2021-04-09 DIAGNOSIS — Z85820 Personal history of malignant melanoma of skin: Secondary | ICD-10-CM | POA: Diagnosis not present

## 2021-04-09 DIAGNOSIS — L570 Actinic keratosis: Secondary | ICD-10-CM | POA: Diagnosis not present

## 2021-04-12 NOTE — Progress Notes (Signed)
Chronic Care Management Pharmacy Note   Summary:  FU visit - patient doing well.  Due for colonoscopy.  04/16/2021 Name:  Thomas Craig MRN:  [id number] DOB:  1946/02/17  Subjective: Thomas Craig is an 75 y.o. year old male who is a primary patient of Hunter, Brayton Mars, MD.  The CCM team was consulted for assistance with disease management and care coordination needs.   Engaged with patient by telephone for follow up visit in response to provider referral for pharmacy case management and/or care coordination services.   Consent to Services:  The patient was given information about Chronic Care Management services, agreed to services, and gave verbal consent prior to initiation of services.  Please see initial visit note for detailed documentation.  Patient Care Team: Marin Olp, MD as PCP - General (Family Medicine) Edythe Clarity, [facility] (Pharmacist)  Recent office visits:  12/13/20-Stephen Yong Channel MD (PCP)- Office visit for management of chronic conditions. No medication changes. Follow up in 6 months.    Recent consult visits:  None   Hospital visits:  None in previous 6 months  Objective: Lab Results  Component Value Date   CREATININE 0.95 12/13/2020   BUN 18 12/13/2020   GFR 78.93 12/13/2020   GFRNONAA >60 11/22/2017   GFRAA >60 11/22/2017   NA 131 (L) 12/13/2020   K 3.9 12/13/2020   CALCIUM 9.6 12/13/2020   CO2 30 12/13/2020   Lab Results  Component Value Date/Time   GFR 78.93 12/13/2020 02:46 PM   GFR 82.69 09/15/2019 02:29 PM    Last diabetic Eye exam: No results found for: HMDIABEYEEXA  Last diabetic Foot exam: No results found for: HMDIABFOOTEX  Lab Results  Component Value Date   CHOL 237 (H) 12/13/2020   HDL 105.00 12/13/2020   LDLCALC 118 (H) 12/13/2020   LDLDIRECT 100.5 10/27/2011   TRIG 71.0 12/13/2020   CHOLHDL 2 12/13/2020   Hepatic Function Latest Ref Rng & Units 12/13/2020 09/15/2019 09/15/2018  Total Protein 6.0 - 8.3 g/dL  6.9 7.0 6.0  Albumin 3.5 - 5.2 g/dL 4.6 4.6 3.9  AST 0 - 37 U/L _0 ALT 0 - 53 U/L _1 Alk Phosphatase 39 - 117 U/L 29(L) 33(L) 27(L)  Total Bilirubin 0.2 - 1.2 mg/dL 0.8 0.9 0.8  Bilirubin, Direct 0.0 - 0.3 mg/dL - - -   Lab Results  Component Value Date/Time   TSH 2.40 09/03/2015 08:05 AM   TSH 1.98 10/11/2012 08:41 AM   CBC Latest Ref Rng & Units 12/13/2020 09/15/2019 09/15/2018  WBC 4.0 - 10.5 K/uL 5.8 7.4 3.2(L)  Hemoglobin 13.0 - 17.0 g/dL 14.0 14.0 13.3  Hematocrit 39.0 - 52.0 % 39.6 40.6 38.5(L)  Platelets 150.0 - 400.0 K/uL 190.0 191.0 188.0   No results found for: VD25OH  Clinical ASCVD: No  The ASCVD Risk score (Arnett DK, et al., 2019) failed to calculate for the following reasons:   The valid HDL cholesterol range is 20 to 100 mg/dL    CAC 10/2019: 32 (23rd percentile for age/sex matched control)  Aorta US 09/2020: Abdominal Aorta: No evidence of an abdominal aortic aneurysm was visualized. The largest aortic measurement is 2.8 cm. The bilateral common iliac arteries appear ectatic, with the right measuring 1.7 cm and the left measuring 1.8 cm. Stenosis: No signifcant aorta-iliac atherosclerosis or stenosis noted.   Depression screen [facility] 2/9 12/13/2020 02/14/2020 06/10/2019  Decreased Interest 0 0 0  Down, Depressed, Hopeless 0 0  0  PHQ - 2 Score 0 0 0  Altered sleeping 0 - 0  Tired, decreased energy 0 - 0  Change in appetite 0 - 0  Feeling bad or failure about yourself  0 - 0  Trouble concentrating 0 - 0  Moving slowly or fidgety/restless 0 - 0  Suicidal thoughts 0 - 0  PHQ-9 Score 0 - 0  Difficult doing work/chores Not difficult at all - Not difficult at all    Social History   Tobacco Use  Smoking Status Former   Packs/day: 0.75   Years: 5.00   Pack years: 3.75   Types: Cigarettes   Quit date: 08/05/1975   Years since quitting: 45.7  Smokeless Tobacco Never  Tobacco Comments   ONLY SMOKED FOR 5 Y EARS or less with limited cigarettes   BP  Readings from Last 3 Encounters:  12/13/20 138/82  06/14/20 122/80  02/14/20 (!) 178/82   Pulse Readings from Last 3 Encounters:  12/13/20 83  06/14/20 79  02/14/20 84   Wt Readings from Last 3 Encounters:  12/13/20 145 lb 12.8 oz (66.1 kg)  06/14/20 145 lb 12.8 oz (66.1 kg)  02/14/20 150 lb 3.2 oz (68.1 kg)   Assessment/Interventions: Review of patient past medical history, allergies, medications, health status, including review of consultants reports, laboratory and other test data, was performed as part of comprehensive evaluation and provision of chronic care management services.   SDOH:  (Social Determinants of Health) assessments and interventions performed: Yes  CCM Care Plan Allergies  Allergen Reactions   Sulfa Antibiotics    Medications Reviewed Today     Reviewed by Edythe Clarity, [facility] (Pharmacist) on 04/16/21 at 1124  Med List Status: <None>   Medication Order Taking? Sig Documenting Provider Last Dose Status Informant  amLODipine (NORVASC) 5 MG tablet [id number] Yes Take 1 tablet (5 mg total) by mouth daily. Marin Olp, MD Taking Active   Ascorbic Acid (VITAMIN C) 1000 MG tablet [id number] Yes Take 3,000 mg by mouth daily.  [provider] Taking Active   aspirin EC 81 MG tablet [id number] Yes Take 81 mg by mouth daily. Swallow whole. [provider] Taking Active Self  B Complex-C-Folic Acid (STRESS B COMPLEX PO) [id number] Yes Take by mouth. [provider]  Active   cyanocobalamin 500 MCG tablet [id number] Yes Take 500 mcg by mouth daily. [provider] Taking Active   doxylamine, Sleep, (UNISOM) 25 MG tablet [id number] Yes Take 25 mg by mouth at bedtime as needed. [provider] Taking Active   Glucosamine-Chondroit-Vit C-Mn (GLUCOSAMINE CHONDR [place]) CAPS [id number] Yes Take 3-4 capsules by mouth daily.  [provider] Taking Active   Multiple Vitamin (MULTIVITAMIN) tablet [id number] Yes Take 1 tablet  by mouth daily. Contains iron [provider] Taking Active   multivitamin-lutein [facility]) CAPS [id number] Yes Take 1 capsule by mouth daily. [provider] Taking Active   Omega-3 Fatty Acids (OMEGA-3 PO) [id number] Yes Take 4 capsules by mouth daily. [provider] Taking Active   Saw Palmetto, Serenoa repens, 1000 MG CAPS [id number] Yes Take 2 capsules by mouth daily. [provider] Taking Active            Patient Active Problem List   Diagnosis Date Noted   History of adenomatous polyp of colon 02/23/2018   History of hematuria 06/19/2016   History of melanoma 09/05/2014   BPH (benign prostatic hyperplasia) 09/05/2014   Essential hypertension 09/05/2014   Hyperlipidemia 09/05/2014  Bunion of great toe of left foot 10/20/2011   Immunization History  Administered Date(s) Administered   Fluad Quad(high Dose 65+) 06/14/2020   Hepatitis A 04/20/2009, 12/28/2014   Influenza Whole 07/04/2009   Influenza, High Dose Seasonal PF 03/30/2019   Influenza,inj,Quad PF,6+ Mos 04/20/2013, 03/19/2016, 05/25/2017   Influenza-Unspecified 05/16/2014, 05/16/2015, 06/18/2018   Moderna Sars-Covid-2 Vaccination 01/15/2021   PFIZER(Purple Top)SARS-COV-2 Vaccination 08/24/2019, 09/14/2019, 04/14/2020   Pneumococcal Conjugate-13 09/05/2014   Pneumococcal Polysaccharide-23 10/18/2012   Td 08/04/2005   Tdap 09/20/2015   Zoster, Live 10/18/2012   Conditions to be addressed/monitored:  Hypertension and Hyperlipidemia Care Plan : [place]  Updates made by Edythe Clarity, RPH since 04/16/2021 12:00 AM     Problem: HLD HTN      Long-Range Goal: Disease management   Start Date: 10/02/2020  Expected End Date: 10/02/2021  Recent Progress: On track  Priority: High  Note:    Current Barriers:  Suboptimal control of cholesterol  Pharmacist Clinical Goal(s):  Over the next 365 days, patient will verbalize ability to afford treatment  regimen achieve adherence to monitoring guidelines and medication adherence to achieve therapeutic efficacy contact provider office for questions/concerns as evidenced notation of same in electronic health record through collaboration with PharmD and provider.   Interventions: 1:1 collaboration with Marin Olp, MD regarding development and update of comprehensive plan of care as evidenced by provider attestation and co-signature Inter-disciplinary care team collaboration (see longitudinal plan of care) Comprehensive medication review performed; medication list updated in electronic medical record  Hypertension (BP goal <130/80) -Controlled  -Current treatment: Amlodipine 5 mg once daily -Current home readings: at/near goal at home -Current dietary habits: cooks 2-4 meals per week. Fish or chicken, white meat, all fresh vegetables/fruits. Fruits are main snack. -Current exercise habits: 20-25 miles per week. -Denies hypotensive/hypertensive symptoms -Educated on BP goals and benefits of medications for prevention of heart attack, stroke and kidney damage; Daily salt intake goal < 2300 mg; Exercise goal of 150 minutes per week; -Counseled to monitor BP at home, document, and provide log at future appointments -Counseled on diet and exercise extensively  Update 04/16/21 127/70 avg for last 100 readings Running 25 miles per day now - training for half marathon in [place] the first weekend of November. Denies any dizziness, occasional swelling that resolves with feet elevation. Adherent with med - no concerns at this time.  Continue meds as current.  Hyperlipidemia: (LDL goal <100 without medications) -Not ideally controlled -Current treatment: N/a at this time -ASA for colon (hx of polyps)/CV health -AAA/CAD screenings lower risk, done within last 2 years -Despite ASCVD 10-year risk >20% feels he would be hesitant to start statin due to consistently low total/hdl cholesterol  ratio. -Current dietary patterns: see HTN -Current exercise habits: see HTN -Educated on Cholesterol goals;  Benefits of statin for ASCVD risk reduction; Importance of limiting foods high in cholesterol; Exercise goal of 150 minutes per week; -Counseled on diet and exercise extensively -RPH to Review ASCVD risk and statin use at 6 month telephone visit  Update 04/16/21 Per discussion with Dr. Yong Channel they have decided to hold off on statin medication.  Patient has great HDL and active lifestyle.  Will continue yearly labs to follow.  Continue healthy lifestyle.  Health Maintenance Patient due for colonoscopy, he has already reached out to Dr. Yong Channel to schedule/refer for appointment.  Patient Goals/Self-Care Activities Over the next 180 days, patient will:  - take medications as prescribed target a minimum  of 150 minutes of moderate intensity exercise weekly     Medication Assistance: None required.  Patient affirms current coverage meets needs. Patient's preferred pharmacy is:  [facility], [place] [address] Phone: [phone] Fax: [phone]  Patient decided to: Continue current medication management strategy Patient agrees to Care Plan and Follow-up. Future Appointments  Date Time Provider [place]  06/25/2021  2:40 PM Marin Olp, MD [facility]   Follow-up plan with Care Management Team: FU 6 months  Beverly Milch, PharmD Clinical Pharmacist [phone]

## 2021-04-14 ENCOUNTER — Encounter: Payer: Self-pay | Admitting: Gastroenterology

## 2021-04-15 ENCOUNTER — Telehealth: Payer: Medicare Other

## 2021-04-16 ENCOUNTER — Other Ambulatory Visit: Payer: Self-pay

## 2021-04-16 ENCOUNTER — Encounter: Payer: Self-pay | Admitting: Family Medicine

## 2021-04-16 ENCOUNTER — Ambulatory Visit (INDEPENDENT_AMBULATORY_CARE_PROVIDER_SITE_OTHER): Payer: Medicare Other | Admitting: Pharmacist

## 2021-04-16 DIAGNOSIS — Z1211 Encounter for screening for malignant neoplasm of colon: Secondary | ICD-10-CM

## 2021-04-16 DIAGNOSIS — E785 Hyperlipidemia, unspecified: Secondary | ICD-10-CM

## 2021-04-16 DIAGNOSIS — I10 Essential (primary) hypertension: Secondary | ICD-10-CM

## 2021-04-16 NOTE — Patient Instructions (Addendum)
Visit Information   Goals Addressed             This Visit's Progress    Track and Manage My Blood Pressure-Hypertension       Timeframe:  Long-Range Goal Priority:  High Start Date:   04/16/21                          Expected End Date: 10/14/21                       Follow Up Date 07/16/21    - check blood pressure weekly - choose a place to take my blood pressure (home, clinic or office, retail store) - write blood pressure results in a log or diary    Why is this important?   You won't feel high blood pressure, but it can still hurt your blood vessels.  High blood pressure can cause heart or kidney problems. It can also cause a stroke.  Making lifestyle changes like losing a little weight or eating less salt will help.  Checking your blood pressure at home and at different times of the day can help to control blood pressure.  If the doctor prescribes medicine remember to take it the way the doctor ordered.  Call the office if you cannot afford the medicine or if there are questions about it.     Notes:        Patient Care Plan: CCM Pharmacy Care Plan     Problem Identified: HLD HTN      Long-Range Goal: Disease management   Start Date: 10/02/2020  Expected End Date: 10/02/2021  Recent Progress: On track  Priority: High  Note:    Current Barriers:  Suboptimal control of cholesterol  Pharmacist Clinical Goal(s):  Over the next 365 days, patient will verbalize ability to afford treatment regimen achieve adherence to monitoring guidelines and medication adherence to achieve therapeutic efficacy contact provider office for questions/concerns as evidenced notation of same in electronic health record through collaboration with PharmD and provider.   Interventions: 1:1 collaboration with Marin Olp, MD regarding development and update of comprehensive plan of care as evidenced by provider attestation and co-signature Inter-disciplinary care team collaboration  (see longitudinal plan of care) Comprehensive medication review performed; medication list updated in electronic medical record  Hypertension (BP goal <130/80) -Controlled  -Current treatment: Amlodipine 5 mg once daily -Current home readings: at/near goal at home -Current dietary habits: cooks 2-4 meals per week. Fish or chicken, white meat, all fresh vegetables/fruits. Fruits are main snack. -Current exercise habits: 20-25 miles per week. -Denies hypotensive/hypertensive symptoms -Educated on BP goals and benefits of medications for prevention of heart attack, stroke and kidney damage; Daily salt intake goal < 2300 mg; Exercise goal of 150 minutes per week; -Counseled to monitor BP at home, document, and provide log at future appointments -Counseled on diet and exercise extensively  Update 04/16/21 127/70 avg for last 100 readings Running 25 miles per day now - training for half marathon in [place] the first weekend of November. Denies any dizziness, occasional swelling that resolves with feet elevation. Adherent with med - no concerns at this time.  Continue meds as current.  Hyperlipidemia: (LDL goal <100 without medications) -Not ideally controlled -Current treatment: N/a at this time -ASA for colon (hx of polyps)/CV health -AAA/CAD screenings lower risk, done within last 2 years -Despite ASCVD 10-year risk >20% feels he would be hesitant to  start statin due to consistently low total/hdl cholesterol ratio. -Current dietary patterns: see HTN -Current exercise habits: see HTN -Educated on Cholesterol goals;  Benefits of statin for ASCVD risk reduction; Importance of limiting foods high in cholesterol; Exercise goal of 150 minutes per week; -Counseled on diet and exercise extensively -RPH to Review ASCVD risk and statin use at 6 month telephone visit  Update 04/16/21 Per discussion with Dr. Yong Channel they have decided to hold off on statin medication.  Patient has great HDL  and active lifestyle.  Will continue yearly labs to follow.  Continue healthy lifestyle.  Health Maintenance Patient due for colonoscopy, he has already reached out to Dr. Yong Channel to schedule/refer for appointment.  Patient Goals/Self-Care Activities Over the next 180 days, patient will:  - take medications as prescribed target a minimum of 150 minutes of moderate intensity exercise weekly      Patient verbalizes understanding of instructions provided today and agrees to view in [place].  Telephone follow up appointment with pharmacy team member scheduled for: 6 months  Edythe Clarity, [place]

## 2021-05-03 DIAGNOSIS — I10 Essential (primary) hypertension: Secondary | ICD-10-CM | POA: Diagnosis not present

## 2021-05-03 DIAGNOSIS — E785 Hyperlipidemia, unspecified: Secondary | ICD-10-CM

## 2021-05-21 DIAGNOSIS — H2513 Age-related nuclear cataract, bilateral: Secondary | ICD-10-CM | POA: Diagnosis not present

## 2021-05-21 DIAGNOSIS — H5213 Myopia, bilateral: Secondary | ICD-10-CM | POA: Diagnosis not present

## 2021-05-21 DIAGNOSIS — H52203 Unspecified astigmatism, bilateral: Secondary | ICD-10-CM | POA: Diagnosis not present

## 2021-05-21 DIAGNOSIS — H524 Presbyopia: Secondary | ICD-10-CM | POA: Diagnosis not present

## 2021-05-30 DIAGNOSIS — Z23 Encounter for immunization: Secondary | ICD-10-CM | POA: Diagnosis not present

## 2021-05-31 ENCOUNTER — Encounter: Payer: Self-pay | Admitting: Family Medicine

## 2021-06-17 ENCOUNTER — Encounter: Payer: Self-pay | Admitting: Gastroenterology

## 2021-06-18 ENCOUNTER — Ambulatory Visit: Payer: Medicare Other | Admitting: Family Medicine

## 2021-06-25 ENCOUNTER — Other Ambulatory Visit: Payer: Self-pay

## 2021-06-25 ENCOUNTER — Encounter: Payer: Self-pay | Admitting: Family Medicine

## 2021-06-25 ENCOUNTER — Ambulatory Visit (INDEPENDENT_AMBULATORY_CARE_PROVIDER_SITE_OTHER): Payer: Medicare Other | Admitting: Family Medicine

## 2021-06-25 VITALS — BP 158/82 | HR 74 | Temp 97.8°F | Ht 66.0 in | Wt 146.2 lb

## 2021-06-25 DIAGNOSIS — I10 Essential (primary) hypertension: Secondary | ICD-10-CM

## 2021-06-25 DIAGNOSIS — E785 Hyperlipidemia, unspecified: Secondary | ICD-10-CM | POA: Diagnosis not present

## 2021-06-25 NOTE — Progress Notes (Signed)
Phone [phone] In person visit   Subjective:   Thomas Craig is a 75 y.o. year old very pleasant male patient who presents for/with See problem oriented charting Chief Complaint  Patient presents with   Hypertension   Hyperlipidemia    This visit occurred during the SARS-CoV-2 public health emergency.  Safety protocols were in place, including screening questions prior to the visit, additional usage of staff PPE, and extensive cleaning of exam room while observing appropriate contact time as indicated for disinfecting solutions.   Past Medical History-  Patient Active Problem List   Diagnosis Date Noted   History of melanoma 09/05/2014    Priority: High   History of adenomatous polyp of colon 02/23/2018    Priority: Medium    BPH (benign prostatic hyperplasia) 09/05/2014    Priority: Medium    Essential hypertension 09/05/2014    Priority: Medium    Hyperlipidemia 09/05/2014    Priority: Medium    History of hematuria 06/19/2016    Priority: Low   Bunion of great toe of left foot 10/20/2011    Priority: Low    Medications- reviewed and updated Current Outpatient Medications  Medication Sig Dispense Refill   amLODipine (NORVASC) 5 MG tablet Take 1 tablet (5 mg total) by mouth daily. 90 tablet 3   Ascorbic Acid (VITAMIN C) 1000 MG tablet Take 3,000 mg by mouth daily.      aspirin EC 81 MG tablet Take 81 mg by mouth daily. Swallow whole.     B Complex-C-Folic Acid (STRESS B COMPLEX PO) Take by mouth.     cyanocobalamin 500 MCG tablet Take 500 mcg by mouth daily.     doxylamine, Sleep, (UNISOM) 25 MG tablet Take 25 mg by mouth at bedtime as needed.     Glucosamine-Chondroit-Vit C-Mn (GLUCOSAMINE CHONDR 1500 COMPLX) CAPS Take 3-4 capsules by mouth daily.      Multiple Vitamin (MULTIVITAMIN) tablet Take 1 tablet by mouth daily. Contains iron     multivitamin-lutein (OCUVITE-LUTEIN) CAPS Take 1 capsule by mouth daily.     Omega-3 Fatty Acids (OMEGA-3 PO) Take 4  capsules by mouth daily.     Saw Palmetto, Serenoa repens, 1000 MG CAPS Take 2 capsules by mouth daily.     No current facility-administered medications for this visit.     Objective:  BP 128/67 Comment: most recent home reading  Pulse 74   Temp 97.8 F (36.6 C)   Ht 5\' 6"  (1.676 m)   Wt 146 lb 3.2 oz (66.3 kg)   SpO2 99%   BMI 23.60 kg/m  Gen: NAD, resting comfortably CV: RRR no murmurs rubs or gallops Lungs: CTAB no crackles, wheeze, rhonchi Ext: no edema     Assessment and Plan   #hypertension with white coat element (initial BP high but used home BP) S: medication: amlodipine 5 mg daily Home readings #s: 128/67 pretty consistently at home- over last 100 readings - did a half marathon last Sunday in [place]- had been off for 3 years. Stretching after every run. Avoiding overuse injuries.  BP Readings from Last 3 Encounters:  06/25/21 128/67  12/13/20 138/82  06/14/20 122/80  A/P: stable at home- continue current meds- white coat element in office - have validated home cuff before  #hyperlipidemia S: Medication:Aspirin 81 mg, omega-3 - Mild elevations on coronary calcium score of 32-which was 23rd percentile for age.  Lab Results  Component Value Date   CHOL 237 (H) 12/13/2020   HDL 105.00 12/13/2020  LDLCALC 118 (H) 12/13/2020   LDLDIRECT 100.5 10/27/2011   TRIG 71.0 12/13/2020   CHOLHDL 2 12/13/2020   A/P: his preference has been to remain off meds despite possible benefit- he does want to remain on aspirin and omega-3.   #History of melanoma- follows with dermatology annually-   #History of adenomatous polyp of the colon-02/10/2018 with 3-year repeat planned. Patient was given the contact number to call and scheduled in early January.    #Prostate cancer screening-patient preferred to continue to monitor PSA with BPH with nocturia about twice a night.  Update PSA with less than 50 Lab Results  Component Value Date   PSA 1.81 02/14/2021   PSA 2.68  12/13/2020   PSA 1.90 09/15/2019   #HM- signed up for bivalent booster soon.  - he states we had discussion last visit and I said he was up to date on shingrix- I do not have that recorded below so he will send [place] yellow card so we can verify/update Immunization History  Administered Date(s) Administered   Fluad Quad(high Dose 65+) 06/14/2020, 05/30/2021   Hepatitis A 04/20/2009, 12/28/2014   Influenza Whole 07/04/2009   Influenza, High Dose Seasonal PF 03/30/2019   Influenza,inj,Quad PF,6+ Mos 04/20/2013, 03/19/2016, 05/25/2017   Influenza-Unspecified 05/16/2014, 05/16/2015, 06/18/2018   Moderna Sars-Covid-2 Vaccination 01/15/2021   PFIZER(Purple Top)SARS-COV-2 Vaccination 08/24/2019, 09/14/2019, 04/14/2020   Pneumococcal Conjugate-13 09/05/2014   Pneumococcal Polysaccharide-23 10/18/2012   Td 08/04/2005   Tdap 09/20/2015   Zoster, Live 10/18/2012     Recommended follow up: No follow-ups on file. Future Appointments  Date Time Provider [place]  07/24/2021  4:30 PM [facility] PREVISIT RM 50 [facility]  08/07/2021  8:30 AM Armbruster, Carlota Raspberry, MD [facility]  10/14/2021  3:45 PM [facility] CCM PHARMACIST [facility]    Lab/Order associations:   ICD-10-CM   1. Essential hypertension  I10     2. Hyperlipidemia, unspecified hyperlipidemia type  E78.5       No orders of the defined types were placed in this encounter.  I,Harris Phan,acting as a Education administrator for Garret Reddish, MD.,have documented all relevant documentation on the behalf of Garret Reddish, MD,as directed by  Garret Reddish, MD while in the presence of Garret Reddish, MD.   I, Garret Reddish, MD, have reviewed all documentation for this visit. The documentation on 06/25/21 for the exam, diagnosis, procedures, and orders are all accurate and complete.   Return precautions advised.  Garret Reddish, MD

## 2021-06-25 NOTE — Patient Instructions (Addendum)
Health Maintenance Due  Topic Date Due   Zoster Vaccines- Shingrix (1 of 2) - Please send me a picture of your immunization card along with shingrix information so I can verify.  Never done   COVID-19 Vaccine (5 - Booster for [employer] series) - Please let us know the date of when you have received your omicron/Bivalent booster.   03/12/2021   No labs today.  No changes to your current medications today.   Recommended follow up: Return in about 6 months (around 12/23/2021) for a follow-up or sooner if needed.

## 2021-06-26 ENCOUNTER — Encounter: Payer: Self-pay | Admitting: Family Medicine

## 2021-07-09 DIAGNOSIS — Z23 Encounter for immunization: Secondary | ICD-10-CM | POA: Diagnosis not present

## 2021-07-24 ENCOUNTER — Other Ambulatory Visit: Payer: Self-pay

## 2021-07-24 ENCOUNTER — Ambulatory Visit (AMBULATORY_SURGERY_CENTER): Payer: Self-pay | Admitting: *Deleted

## 2021-07-24 VITALS — Ht 66.0 in | Wt 148.0 lb

## 2021-07-24 DIAGNOSIS — Z86010 Personal history of colonic polyps: Secondary | ICD-10-CM

## 2021-07-24 MED ORDER — NA SULFATE-K SULFATE-MG SULF 17.5-3.13-1.6 GM/177ML PO SOLN: 1.0000 | ORAL | 0 refills | Status: DC

## 2021-07-24 NOTE — Progress Notes (Signed)
Patient is here in-person for PV. Patient denies any allergies to eggs or soy. Patient denies any problems with anesthesia/sedation. Patient is not on any oxygen at home. Patient is not taking any diet/weight loss medications or blood thinners. Patient is aware of our care-partner policy and Covid-19 safety protocol.   EMMI education assigned to the patient for the procedure, sent to MyChart.   Patient is COVID-19 vaccinated.  

## 2021-08-07 ENCOUNTER — Encounter: Payer: Self-pay | Admitting: Gastroenterology

## 2021-08-07 ENCOUNTER — Other Ambulatory Visit: Payer: Self-pay

## 2021-08-07 ENCOUNTER — Ambulatory Visit (AMBULATORY_SURGERY_CENTER): Payer: Medicare Other | Admitting: Gastroenterology

## 2021-08-07 VITALS — BP 105/68 | HR 53 | Temp 97.1°F | Resp 15 | Ht 66.0 in | Wt 148.0 lb

## 2021-08-07 DIAGNOSIS — Z86010 Personal history of colonic polyps: Secondary | ICD-10-CM | POA: Diagnosis not present

## 2021-08-07 DIAGNOSIS — D122 Benign neoplasm of ascending colon: Secondary | ICD-10-CM

## 2021-08-07 DIAGNOSIS — D120 Benign neoplasm of cecum: Secondary | ICD-10-CM | POA: Diagnosis not present

## 2021-08-07 DIAGNOSIS — D123 Benign neoplasm of transverse colon: Secondary | ICD-10-CM | POA: Diagnosis not present

## 2021-08-07 DIAGNOSIS — D124 Benign neoplasm of descending colon: Secondary | ICD-10-CM | POA: Diagnosis not present

## 2021-08-07 DIAGNOSIS — I10 Essential (primary) hypertension: Secondary | ICD-10-CM | POA: Diagnosis not present

## 2021-08-07 MED ORDER — SODIUM CHLORIDE 0.9 % IV SOLN: 500.0000 mL | INTRAVENOUS | Status: DC

## 2021-08-07 NOTE — Op Note (Signed)
[place] Patient Name: Thomas Craig Procedure Date: 08/07/2021 8:16 AM MRN: [id number] Endoscopist: Remo Lipps P. Havery [name] , MD Age: 76 Referring MD:  Date of Birth: 02/16/1946 Gender: Male Account #: [id number] Procedure:                Colonoscopy Indications:              High risk colon cancer surveillance: Personal                            history of colonic polyps (7 polyps removed 02/2018                            - TAs / SSPs) Medicines:                Monitored Anesthesia Care Procedure:                Pre-Anesthesia Assessment:                           - Prior to the procedure, a History and Physical                            was performed, and patient medications and                            allergies were reviewed. The patient's tolerance of                            previous anesthesia was also reviewed. The risks                            and benefits of the procedure and the sedation                            options and risks were discussed with the patient.                            All questions were answered, and informed consent                            was obtained. Prior Anticoagulants: The patient has                            taken no previous anticoagulant or antiplatelet                            agents. ASA Grade Assessment: II - A patient with                            mild systemic disease. After reviewing the risks                            and benefits, the patient was deemed in  satisfactory condition to undergo the procedure.                           After obtaining informed consent, the colonoscope                            was passed under direct vision. Throughout the                            procedure, the patient's blood pressure, pulse, and                            oxygen saturations were monitored continuously. The                            Olympus PCF-[id number]) Colonoscope  was                            introduced through the anus and advanced to the the                            cecum, identified by appendiceal orifice and                            ileocecal valve. The colonoscopy was performed                            without difficulty. The patient tolerated the                            procedure well. The quality of the bowel                            preparation was adequate. The ileocecal valve,                            appendiceal orifice, and rectum were photographed. Scope In: 8:36:10 AM Scope Out: 9:00:27 AM Scope Withdrawal Time: 0 hours 21 minutes 10 seconds  Total Procedure Duration: 0 hours 24 minutes 17 seconds  Findings:                 The perianal and digital rectal examinations were                            normal.                           A diminutive polyp was found in the cecum. The                            polyp was sessile. The polyp was removed with a                            cold snare. Resection and retrieval were complete.  Two sessile polyps were found in the ascending                            colon. The polyps were diminutive in size. These                            polyps were removed with a cold snare. Resection                            and retrieval were complete.                           Three sessile polyps were found in the hepatic                            flexure. The polyps were 2 to 4 mm in size. These                            polyps were removed with a cold snare. Resection                            and retrieval were complete.                           A diminutive polyp was found in the transverse                            colon. The polyp was sessile. The polyp was removed                            with a cold snare. Resection and retrieval were                            complete.                           A few small-mouthed diverticula were found in the                             sigmoid colon.                           Internal hemorrhoids were found during                            retroflexion. The hemorrhoids were small.                           The left colon was tortuous.                           The exam was otherwise without abnormality. Complications:            No immediate complications. Estimated blood loss:  Minimal. Estimated Blood Loss:     Estimated blood loss was minimal. Impression:               - One diminutive polyp in the cecum, removed with a                            cold snare. Resected and retrieved.                           - Two diminutive polyps in the ascending colon,                            removed with a cold snare. Resected and retrieved.                           - Three 2 to 4 mm polyps at the hepatic flexure,                            removed with a cold snare. Resected and retrieved.                           - One diminutive polyp in the transverse colon,                            removed with a cold snare. Resected and retrieved.                           - Diverticulosis in the sigmoid colon.                           - Internal hemorrhoids.                           - Tortuous colon. Recommendation:           - Patient has a contact number available for                            emergencies. The signs and symptoms of potential                            delayed complications were discussed with the                            patient. Return to normal activities tomorrow.                            Written discharge instructions were provided to the                            patient.                           - Resume previous diet.                           -  Continue present medications.                           - Await pathology results. Remo Lipps P. [name], MD 08/07/2021 9:06:25 AM This report has been signed electronically.

## 2021-08-07 NOTE — Patient Instructions (Signed)
Read all of the handouts given to you by your recovery room nurse.  Resume your previous medications.  YOU HAD AN ENDOSCOPIC PROCEDURE TODAY AT [facility]:   Refer to the procedure report that was given to you for any specific questions about what was found during the examination.  If the procedure report does not answer your questions, please call your gastroenterologist to clarify.  If you requested that your care partner not be given the details of your procedure findings, then the procedure report has been included in a sealed envelope for you to review at your convenience later.  YOU SHOULD EXPECT: Some feelings of bloating in the abdomen. Passage of more gas than usual.  Walking can help get rid of the air that was put into your GI tract during the procedure and reduce the bloating. If you had a lower endoscopy (such as a colonoscopy or flexible sigmoidoscopy) you may notice spotting of blood in your stool or on the toilet paper. If you underwent a bowel prep for your procedure, you may not have a normal bowel movement for a few days.  Please Note:  You might notice some irritation and congestion in your nose or some drainage.  This is from the oxygen used during your procedure.  There is no need for concern and it should clear up in a day or so.  SYMPTOMS TO REPORT IMMEDIATELY:  Following lower endoscopy (colonoscopy or flexible sigmoidoscopy):  Excessive amounts of blood in the stool  Significant tenderness or worsening of abdominal pains  Swelling of the abdomen that is new, acute  Fever of 100F or higher   For urgent or emergent issues, a gastroenterologist can be reached at any hour by calling [phone]. Do not use MyChart messaging for urgent concerns.    DIET:  We do recommend a small meal at first, but then you may proceed to your regular diet.  Drink plenty of fluids but you should avoid alcoholic beverages for 24 hours. Try to increase the fiber in your  diet, and drink plenty of water.  ACTIVITY:  You should plan to take it easy for the rest of today and you should NOT DRIVE or use heavy machinery until tomorrow (because of the sedation medicines used during the test).    FOLLOW UP: Our staff will call the number listed on your records 48-72 hours following your procedure to check on you and address any questions or concerns that you may have regarding the information given to you following your procedure. If we do not reach you, we will leave a message.  We will attempt to reach you two times.  During this call, we will ask if you have developed any symptoms of COVID 19. If you develop any symptoms (ie: fever, flu-like symptoms, shortness of breath, cough etc.) before then, please call [phone].  If you test positive for Covid 19 in the 2 weeks post procedure, please call and report this information to [place].    If any biopsies were taken you will be contacted by phone or by letter within the next 1-3 weeks.  Please call us at [phone] if you have not heard about the biopsies in 3 weeks.    SIGNATURES/CONFIDENTIALITY: You and/or your care partner have signed paperwork which will be entered into your electronic medical record.  These signatures attest to the fact that that the information above on your After Visit Summary has been reviewed and is understood.  Full responsibility  of the confidentiality of this discharge information lies with you and/or your care-partner.

## 2021-08-07 NOTE — Progress Notes (Signed)
To pacu, VSS. Report to [email or web address] 

## 2021-08-07 NOTE — Progress Notes (Signed)
[facility] History and Physical   Primary Care Physician:  Marin Olp, MD   Reason for Procedure:   History of colon polyps  Plan:    colonoscopy     HPI: Thomas Craig is a 76 y.o. male  here for colonoscopy surveillance - 7 TAs / SSPs removed in 02/2018. Patient denies any bowel symptoms at this time. No family history of colon cancer known. Otherwise feels well without any cardiopulmonary symptoms.    Past Medical History:  Diagnosis Date   BPH (benign prostatic hyperplasia) 09/05/2014   Saw palmetto has improved nocturia 3-5x a night to 1-2x a night.     Bunion of great toe of left foot 10/20/2011   Hallux valgus shift on left with some early changes starting on RT    Hypertension    Melanoma of skin ([place]) 09/05/2014   Dr. Allyson Sabal. Every 6 month follow up.      Past Surgical History:  Procedure Laterality Date   COLONOSCOPY  10/2007   [facility] - normal   COLONOSCOPY WITH PROPOFOL  02/10/2018   Dr.Rinoa Garramone   MELANOMA EXCISION     right arm   POLYPECTOMY     TOOTH EXTRACTION     only 1 wisdom tooth ext    Prior to Admission medications   Medication Sig Start Date End Date Taking? Authorizing Provider  amLODipine (NORVASC) 5 MG tablet Take 1 tablet (5 mg total) by mouth daily. 09/11/20  Yes Marin Olp, MD  Ascorbic Acid (VITAMIN C) 1000 MG tablet Take 3,000 mg by mouth daily.    Yes [provider]  aspirin EC 81 MG tablet Take 81 mg by mouth daily. Swallow whole.   Yes [provider]  B Complex-C-Folic Acid (STRESS B COMPLEX PO) Take by mouth.   Yes [provider]  cyanocobalamin 500 MCG tablet Take 500 mcg by mouth daily.   Yes [provider]  Glucosamine-Chondroit-Vit C-Mn (GLUCOSAMINE CHONDR 1500 COMPLX) CAPS Take 3-4 capsules by mouth daily.    Yes [provider]  Multiple Vitamin (MULTIVITAMIN) tablet Take 1 tablet by mouth daily. Contains iron   Yes [provider]   multivitamin-lutein (OCUVITE-LUTEIN) CAPS Take 1 capsule by mouth daily.   Yes [provider]  Omega-3 Fatty Acids (OMEGA-3 PO) Take 4 capsules by mouth daily.   Yes [provider]  Saw Palmetto, Serenoa repens, 1000 MG CAPS Take 2 capsules by mouth daily.   Yes [provider]  doxylamine, Sleep, (UNISOM) 25 MG tablet Take 25 mg by mouth at bedtime as needed.    [provider]    Current Outpatient Medications  Medication Sig Dispense Refill   amLODipine (NORVASC) 5 MG tablet Take 1 tablet (5 mg total) by mouth daily. 90 tablet 3   Ascorbic Acid (VITAMIN C) 1000 MG tablet Take 3,000 mg by mouth daily.      aspirin EC 81 MG tablet Take 81 mg by mouth daily. Swallow whole.     B Complex-C-Folic Acid (STRESS B COMPLEX PO) Take by mouth.     cyanocobalamin 500 MCG tablet Take 500 mcg by mouth daily.     Glucosamine-Chondroit-Vit C-Mn (GLUCOSAMINE CHONDR 1500 COMPLX) CAPS Take 3-4 capsules by mouth daily.      Multiple Vitamin (MULTIVITAMIN) tablet Take 1 tablet by mouth daily. Contains iron     multivitamin-lutein (OCUVITE-LUTEIN) CAPS Take 1 capsule by mouth daily.     Omega-3 Fatty Acids (OMEGA-3 PO) Take 4 capsules by  mouth daily.     Saw Palmetto, Serenoa repens, 1000 MG CAPS Take 2 capsules by mouth daily.     doxylamine, Sleep, (UNISOM) 25 MG tablet Take 25 mg by mouth at bedtime as needed.     Current Facility-Administered Medications  Medication Dose Route Frequency Provider Last Rate Last Admin   0.9 %  sodium chloride infusion  500 mL Intravenous Continuous Rhayne Chatwin, Carlota Raspberry, MD        Allergies as of 08/07/2021 - Review Complete 08/07/2021  Allergen Reaction Noted   Sulfa antibiotics  11/22/2017    Family History  Problem Relation Age of Onset   Alzheimer's disease Mother        lived to 48   Melanoma Father    Alzheimer's disease Father        lived to 66   Hypertension Father    Anxiety disorder Sister        agoraphobia    Colon cancer Neg Hx    Rectal cancer Neg Hx    Stomach cancer Neg Hx    Colon polyps Neg Hx    Esophageal cancer Neg Hx     Social History   Socioeconomic History   Marital status: Married    Spouse name: Not on file   Number of children: Not on file   Years of education: Not on file   Highest education level: Not on file  Occupational History   Not on file  Tobacco Use   Smoking status: Former    Packs/day: 0.75    Years: 5.00    Pack years: 3.75    Types: Cigarettes    Quit date: 08/05/1975    Years since quitting: 46.0   Smokeless tobacco: Never   Tobacco comments:    ONLY SMOKED FOR 5 Y EARS or less with limited cigarettes  Vaping Use   Vaping Use: Never used  Substance and Sexual Activity   Alcohol use: Yes    Alcohol/week: 10.0 - 14.0 standard drinks    Types: 10 - 14 Standard drinks or equivalent per week    Comment: Beer/wine   Drug use: No   Sexual activity: Yes  Other Topics Concern   Not on file  Social History Narrative   Married 1975 (Wife with another Conservation officer, historic buildings). 3 daughters (38, 36,34 in 2016). 2 granddaughters (only youngest married)      Retired from [employer] for [employer]	[place]. Ran tax group.    Doing some consulting. Increase exercise. Time with dogs. Started back with rotary club). Traveling more.  Marathon October 2017 in [place].       Hobbies: running (huge passion runs marathons), biking, gardening, occasional golf   Social Determinants of Health   Financial Resource Strain: Not on file  Food Insecurity: Not on file  Transportation Needs: Not on file  Physical Activity: Not on file  Stress: Not on file  Social Connections: Not on file  Intimate Partner Violence: Not on file    Review of Systems: All other review of systems negative except as mentioned in the HPI.  Physical Exam: Vital signs BP (!) 154/83    Pulse 89    Temp (!) 97.1 F (36.2 C) (Temporal)    Resp 14    Ht 5\' 6"  (1.676 m)    Wt 148 lb (67.1 kg)     SpO2 (!) 89%    BMI 23.89 kg/m   General:   Alert,  Well-developed, pleasant and cooperative in NAD Lungs:  Clear throughout to auscultation.   Heart:  Regular rate and rhythm Abdomen:  Soft, nontender and nondistended.   Neuro/Psych:  Alert and cooperative. Normal mood and affect. A and O x 3  Jolly Mango, MD [facility]

## 2021-08-07 NOTE — Progress Notes (Signed)
Pt's states no medical or surgical changes since previsit or office visit. 

## 2021-08-09 ENCOUNTER — Telehealth: Payer: Self-pay

## 2021-08-09 NOTE — Telephone Encounter (Signed)
°  Follow up Call-  Call back number 08/07/2021  Post procedure Call Back phone  # [phone]  Permission to leave phone message Yes  Some recent data might be hidden     Patient questions:  Do you have a fever, pain , or abdominal swelling? No. Pain Score  0 *  Have you tolerated food without any problems? Yes.    Have you been able to return to your normal activities? Yes.    Do you have any questions about your discharge instructions: Diet   No. Medications  No. Follow up visit  No.  Do you have questions or concerns about your Care? Yes.    Actions: * If pain score is 4 or above: No action needed, pain <4.

## 2021-08-17 DIAGNOSIS — Z20822 Contact with and (suspected) exposure to covid-19: Secondary | ICD-10-CM | POA: Diagnosis not present

## 2021-09-04 ENCOUNTER — Encounter: Payer: Self-pay | Admitting: Family Medicine

## 2021-09-09 ENCOUNTER — Encounter: Payer: Self-pay | Admitting: Family Medicine

## 2021-09-09 ENCOUNTER — Ambulatory Visit (INDEPENDENT_AMBULATORY_CARE_PROVIDER_SITE_OTHER): Payer: Medicare Other

## 2021-09-09 ENCOUNTER — Other Ambulatory Visit: Payer: Self-pay

## 2021-09-09 DIAGNOSIS — Z0000 Encounter for general adult medical examination without abnormal findings: Secondary | ICD-10-CM | POA: Diagnosis not present

## 2021-09-09 NOTE — Progress Notes (Signed)
Virtual Visit via Telephone Note  I connected with  Thomas Craig on 09/09/21 at  1:45 PM EST by telephone and verified that I am speaking with the correct person using two identifiers.  Medicare Annual Wellness visit completed telephonically due to Covid-19 pandemic.   Persons participating in this call: This Health Coach and this patient.   Location: Patient: Home Provider: Office    I discussed the limitations, risks, security and privacy concerns of performing an evaluation and management service by telephone and the availability of in person appointments. The patient expressed understanding and agreed to proceed.  Unable to perform video visit due to video visit attempted and failed and/or patient does not have video capability.   Some vital signs may be absent or patient reported.   Willette Brace, LPN   Subjective:   Thomas Craig is a 76 y.o. male who presents for Medicare Annual/Subsequent preventive examination.  Review of Systems     Cardiac Risk Factors include: advanced age (>43men, >67 women);hypertension;dyslipidemia;male gender     Objective:    There were no vitals filed for this visit. There is no height or weight on file to calculate BMI.  Advanced Directives 09/09/2021 06/10/2019 12/18/2016  Does Patient Have a Medical Advance Directive? Yes Yes Yes  Type of Paramedic of [place];Living will [place];Living will -  Does patient want to make changes to medical advance directive? - No - Patient declined -  Copy of [place] in Chart? No - copy requested No - copy requested -    Current Medications (verified) Outpatient Encounter Medications as of 09/09/2021  Medication Sig   amLODipine (NORVASC) 5 MG tablet Take 1 tablet (5 mg total) by mouth daily.   Ascorbic Acid (VITAMIN C) 1000 MG tablet Take 3,000 mg by mouth daily.    aspirin EC 81 MG tablet Take 81 mg by mouth daily. Swallow  whole.   B Complex-C-Folic Acid (STRESS B COMPLEX PO) Take by mouth.   cyanocobalamin 500 MCG tablet Take 500 mcg by mouth daily.   doxylamine, Sleep, (UNISOM) 25 MG tablet Take 25 mg by mouth at bedtime as needed.   Glucosamine-Chondroit-Vit C-Mn (GLUCOSAMINE CHONDR 1500 COMPLX) CAPS Take 3-4 capsules by mouth daily.    Multiple Vitamin (MULTIVITAMIN) tablet Take 1 tablet by mouth daily. Contains iron   multivitamin-lutein (OCUVITE-LUTEIN) CAPS Take 1 capsule by mouth daily.   Omega-3 Fatty Acids (OMEGA-3 PO) Take 4 capsules by mouth daily.   Saw Palmetto, Serenoa repens, 1000 MG CAPS Take 2 capsules by mouth daily.   No facility-administered encounter medications on file as of 09/09/2021.    Allergies (verified) Sulfa antibiotics   History: Past Medical History:  Diagnosis Date   BPH (benign prostatic hyperplasia) 09/05/2014   Saw palmetto has improved nocturia 3-5x a night to 1-2x a night.     Bunion of great toe of left foot 10/20/2011   Hallux valgus shift on left with some early changes starting on RT    Hypertension    Melanoma of skin ([place]) 09/05/2014   Dr. Allyson Sabal. Every 6 month follow up.     Past Surgical History:  Procedure Laterality Date   COLONOSCOPY  10/2007   kaplan - normal   COLONOSCOPY WITH PROPOFOL  02/10/2018   Dr.Armbruster   MELANOMA EXCISION     right arm   POLYPECTOMY     TOOTH EXTRACTION     only 1 wisdom tooth ext  Family History  Problem Relation Age of Onset   Alzheimer's disease Mother        lived to 77   Melanoma Father    Alzheimer's disease Father        lived to 15   Hypertension Father    Anxiety disorder Sister        agoraphobia   Colon cancer Neg Hx    Rectal cancer Neg Hx    Stomach cancer Neg Hx    Colon polyps Neg Hx    Esophageal cancer Neg Hx    Social History   Socioeconomic History   Marital status: Married    Spouse name: Not on file   Number of children: Not on file   Years of education: Not on file   Highest  education level: Not on file  Occupational History   Not on file  Tobacco Use   Smoking status: Former    Packs/day: 0.75    Years: 5.00    Pack years: 3.75    Types: Cigarettes    Quit date: 08/05/1975    Years since quitting: 46.1   Smokeless tobacco: Never   Tobacco comments:    ONLY SMOKED FOR 5 Y EARS or less with limited cigarettes  Vaping Use   Vaping Use: Never used  Substance and Sexual Activity   Alcohol use: Yes    Alcohol/week: 10.0 - 14.0 standard drinks    Types: 10 - 14 Standard drinks or equivalent per week    Comment: Beer/wine   Drug use: No   Sexual activity: Yes  Other Topics Concern   Not on file  Social History Narrative   Married 1975 (Wife with another Conservation officer, historic buildings). 3 daughters (38, 36,34 in 2016). 2 granddaughters (only youngest married)      Retired from [employer] for [employer]	[place]. Ran tax group.    Doing some consulting. Increase exercise. Time with dogs. Started back with rotary club). Traveling more.  Marathon October 2017 in [place].       Hobbies: running (huge passion runs marathons), biking, gardening, occasional golf   Social Determinants of Health   Financial Resource Strain: Low Risk    Difficulty of Paying Living Expenses: Not hard at all  Food Insecurity: No Food Insecurity   Worried About Charity fundraiser in the Last Year: Never true   Arboriculturist in the Last Year: Never true  Transportation Needs: No Transportation Needs   Lack of Transportation (Medical): No   Lack of Transportation (Non-Medical): No  Physical Activity: Sufficiently Active   Days of Exercise per Week: 6 days   Minutes of Exercise per Session: 60 min  Stress: No Stress Concern Present   Feeling of Stress : Not at all  Social Connections: Socially Integrated   Frequency of Communication with Friends and Family: More than three times a week   Frequency of Social Gatherings with Friends and Family: More than three times a week   Attends  Religious Services: More than 4 times per year   Active Member of [employer] or Organizations: Yes   Attends Archivist Meetings: 1 to 4 times per year   Marital Status: Married    Tobacco Counseling Counseling given: Not Answered Tobacco comments: ONLY SMOKED FOR 5 Y EARS or less with limited cigarettes   Clinical Intake:  Pre-visit preparation completed: Yes  Pain : No/denies pain     BMI - recorded: 23.9 Nutritional Status: BMI of 19-24  Normal Nutritional Risks: None Diabetes: No  How often do you need to have someone help you when you read instructions, pamphlets, or other written materials from your doctor or pharmacy?: 1 - Never  Diabetic?no  Interpreter Needed?: No  Information entered by :: Charlott Rakes, LPN   Activities of Daily Living In your present state of health, do you have any difficulty performing the following activities: 09/09/2021  Hearing? N  Vision? N  Difficulty concentrating or making decisions? N  Walking or climbing stairs? N  Dressing or bathing? N  Doing errands, shopping? N  Preparing Food and eating ? N  Using the Toilet? N  In the past six months, have you accidently leaked urine? N  Do you have problems with loss of bowel control? N  Managing your Medications? N  Managing your Finances? N  Housekeeping or managing your Housekeeping? N  Some recent data might be hidden    Patient Care Team: Marin Olp, MD as PCP - General (Family Medicine) Edythe Clarity, [facility] (Pharmacist)  Indicate any recent Medical Services you may have received from other than [facility] providers in the past year (date may be approximate).     Assessment:   This is a routine wellness examination for [place].  Hearing/Vision screen Hearing Screening - Comments:: Pt denies any hearing issues  Vision Screening - Comments:: Pt follows up with  Dr Theda Sers for annual eye exams   Dietary issues and exercise activities discussed: Current  Exercise Habits: Home exercise routine;Structured exercise class, Type of exercise: Other - see comments;yoga, Time (Minutes): 60, Frequency (Times/Week): 6, Weekly Exercise (Minutes/Week): 360   Goals Addressed             This Visit's Progress    Patient Stated       Pt stated increase running and speed        Depression Screen PHQ 2/9 Scores 09/09/2021 06/25/2021 12/13/2020 02/14/2020 06/10/2019 09/07/2018 12/18/2016  PHQ - 2 Score 0 0 0 0 0 0 0  PHQ- 9 Score - - 0 - 0 0 -    Fall Risk Fall Risk  09/09/2021 06/25/2021 12/13/2020 02/14/2020 06/10/2019  Falls in the past year? 0 0 1 0 0  Comment - - - - -  Number falls in past yr: 0 0 0 0 0  Injury with Fall? 0 0 1 0 0  Risk for fall due to : Impaired vision No Fall Risks - - -  Follow up Falls prevention discussed - - Falls evaluation completed -    FALL RISK PREVENTION PERTAINING TO THE HOME:  Any stairs in or around the home? Yes  If so, are there any without handrails? No  Home free of loose throw rugs in walkways, pet beds, electrical cords, etc? Yes  Adequate lighting in your home to reduce risk of falls? Yes   ASSISTIVE DEVICES UTILIZED TO PREVENT FALLS:  Life alert? No  Use of a cane, walker or w/c? No  Grab bars in the bathroom? No  Shower chair or bench in shower? No  Elevated toilet seat or a handicapped toilet? No   TIMED UP AND GO:  Was the test performed? No .   Cognitive Function: MMSE - Mini Mental State Exam 12/18/2016  Not completed: (No Data)     6CIT Screen 09/09/2021 06/10/2019  What Year? 0 points 0 points  What month? 0 points 0 points  What time? 0 points 0 points  Count back from 20 0  points 0 points  Months in reverse 0 points 0 points  Repeat phrase 0 points 0 points  Total Score 0 0    Immunizations Immunization History  Administered Date(s) Administered   Fluad Quad(high Dose 65+) 06/14/2020, 05/30/2021   Hepatitis A 04/20/2009, 12/28/2014   Influenza Whole 07/04/2009   Influenza,  High Dose Seasonal PF 03/30/2019   Influenza,inj,Quad PF,6+ Mos 04/20/2013, 03/19/2016, 05/25/2017   Influenza-Unspecified 05/16/2014, 05/16/2015, 06/18/2018   Moderna Sars-Covid-2 Vaccination 01/15/2021, 07/09/2021   PFIZER(Purple Top)SARS-COV-2 Vaccination 08/24/2019, 09/14/2019, 04/14/2020   Pneumococcal Conjugate-13 09/05/2014   Pneumococcal Polysaccharide-23 10/18/2012   Td 08/04/2005   Tdap 09/20/2015   Zoster, Live 10/18/2012    TDAP status: Up to date  Flu Vaccine status: Up to date  Pneumococcal vaccine status: Up to date  Covid-19 vaccine status: Completed vaccines  Qualifies for Shingles Vaccine? Yes   Zostavax completed Yes   Shingrix Completed?: No.    Education has been provided regarding the importance of this vaccine. Patient has been advised to call insurance company to determine out of pocket expense if they have not yet received this vaccine. Advised may also receive vaccine at local pharmacy or Health Dept. Verbalized acceptance and understanding.  Screening Tests Health Maintenance  Topic Date Due   Zoster Vaccines- Shingrix (1 of 2) Never done   COLONOSCOPY (Pts 45-86yrs Insurance coverage will need to be confirmed)  08/07/2024   TETANUS/TDAP  09/19/2025   Pneumonia Vaccine 62+ Years old  Completed   INFLUENZA VACCINE  Completed   COVID-19 Vaccine  Completed   Hepatitis C Screening  Completed   HPV VACCINES  Aged Out    Health Maintenance  Health Maintenance Due  Topic Date Due   Zoster Vaccines- Shingrix (1 of 2) Never done    Colorectal cancer screening: Type of screening: Colonoscopy. Completed 08/07/21. Repeat every 3 years   Additional Screening:  Hepatitis C Screening:  Completed 09/03/15  Vision Screening: Recommended annual ophthalmology exams for early detection of glaucoma and other disorders of the eye. Is the patient up to date with their annual eye exam?  Yes  Who is the provider or what is the name of the office in which the  patient attends annual eye exams? Dr Delman Cheadle If pt is not established with a provider, would they like to be referred to a provider to establish care? No .   Dental Screening: Recommended annual dental exams for proper oral hygiene  Community Resource Referral / Chronic Care Management: CRR required this visit?  No   CCM required this visit?  No      Plan:     I have personally reviewed and noted the following in the patients chart:   Medical and social history Use of alcohol, tobacco or illicit drugs  Current medications and supplements including opioid prescriptions. Patient is not currently taking opioid prescriptions. Functional ability and status Nutritional status Physical activity Advanced directives List of other physicians Hospitalizations, surgeries, and ER visits in previous 12 months Vitals Screenings to include cognitive, depression, and falls Referrals and appointments  In addition, I have reviewed and discussed with patient certain preventive protocols, quality metrics, and best practice recommendations. A written personalized care plan for preventive services as well as general preventive health recommendations were provided to patient.     Willette Brace, LPN   04/09/7590   Nurse Notes: None

## 2021-09-09 NOTE — Patient Instructions (Signed)
Thomas Craig , Thank you for taking time to come for your Medicare Wellness Visit. I appreciate your ongoing commitment to your health goals. Please review the following plan we discussed and let me know if I can assist you in the future.   Screening recommendations/referrals: Colonoscopy: Done 08/07/21 repeat  every 3  years  Recommended yearly ophthalmology/optometry visit for glaucoma screening and checkup Recommended yearly dental visit for hygiene and checkup  Vaccinations: Influenza vaccine: Done 05/30/21 repeat very year  Pneumococcal vaccine: Up to date Tdap vaccine: Done 09/20/15 repeat very 10 years  Shingles vaccine: Shingrix discussed. Please contact your pharmacy for coverage information.    Covid-19: Completed 1/20, 2/10, 04/14/20 & 6/14, 07/09/21  Advanced directives: Please bring a copy of your health care power of attorney and living will to the office at your convenience.  Conditions/risks identified: increase   Next appointment: Follow up in one year for your annual wellness visit.   Preventive Care 2 Years and Older, Male Preventive care refers to lifestyle choices and visits with your health care provider that can promote health and wellness. What does preventive care include? A yearly physical exam. This is also called an annual well check. Dental exams once or twice a year. Routine eye exams. Ask your health care provider how often you should have your eyes checked. Personal lifestyle choices, including: Daily care of your teeth and gums. Regular physical activity. Eating a healthy diet. Avoiding tobacco and drug use. Limiting alcohol use. Practicing safe sex. Taking low doses of aspirin every day. Taking vitamin and mineral supplements as recommended by your health care provider. What happens during an annual well check? The services and screenings done by your health care provider during your annual well check will depend on your age, overall health,  lifestyle risk factors, and family history of disease. Counseling  Your health care provider may ask you questions about your: Alcohol use. Tobacco use. Drug use. Emotional well-being. Home and relationship well-being. Sexual activity. Eating habits. History of falls. Memory and ability to understand (cognition). Work and work Statistician. Screening  You may have the following tests or measurements: Height, weight, and BMI. Blood pressure. Lipid and cholesterol levels. These may be checked every 5 years, or more frequently if you are over 5 years old. Skin check. Lung cancer screening. You may have this screening every year starting at age 70 if you have a 30-pack-year history of smoking and currently smoke or have quit within the past 15 years. Fecal occult blood test (FOBT) of the stool. You may have this test every year starting at age 31. Flexible sigmoidoscopy or colonoscopy. You may have a sigmoidoscopy every 5 years or a colonoscopy every 10 years starting at age 25. Prostate cancer screening. Recommendations will vary depending on your family history and other risks. Hepatitis C blood test. Hepatitis B blood test. Sexually transmitted disease (STD) testing. Diabetes screening. This is done by checking your blood sugar (glucose) after you have not eaten for a while (fasting). You may have this done every 1-3 years. Abdominal aortic aneurysm (AAA) screening. You may need this if you are a current or former smoker. Osteoporosis. You may be screened starting at age 67 if you are at high risk. Talk with your health care provider about your test results, treatment options, and if necessary, the need for more tests. Vaccines  Your health care provider may recommend certain vaccines, such as: Influenza vaccine. This is recommended every year. Tetanus, diphtheria, and acellular pertussis (  Tdap, Td) vaccine. You may need a Td booster every 10 years. Zoster vaccine. You may need this  after age 34. Pneumococcal 13-valent conjugate (PCV13) vaccine. One dose is recommended after age 37. Pneumococcal polysaccharide (PPSV23) vaccine. One dose is recommended after age 9. Talk to your health care provider about which screenings and vaccines you need and how often you need them. This information is not intended to replace advice given to you by your health care provider. Make sure you discuss any questions you have with your health care provider. Document Released: 08/17/2015 Document Revised: 04/09/2016 Document Reviewed: 05/22/2015 Elsevier Interactive Patient Education  2017 [employer] [employer] Prevention in the Home Falls can cause injuries. They can happen to people of all ages. There are many things you can do to make your home safe and to help prevent falls. What can I do on the outside of my home? Regularly fix the edges of walkways and driveways and fix any cracks. Remove anything that might make you trip as you walk through a door, such as a raised step or threshold. Trim any bushes or trees on the path to your home. Use bright outdoor lighting. Clear any walking paths of anything that might make someone trip, such as rocks or tools. Regularly check to see if handrails are loose or broken. Make sure that both sides of any steps have handrails. Any raised decks and porches should have guardrails on the edges. Have any leaves, snow, or ice cleared regularly. Use sand or salt on walking paths during winter. Clean up any spills in your garage right away. This includes oil or grease spills. What can I do in the bathroom? Use night lights. Install grab bars by the toilet and in the tub and shower. Do not use towel bars as grab bars. Use non-skid mats or decals in the tub or shower. If you need to sit down in the shower, use a plastic, non-slip stool. Keep the floor dry. Clean up any water that spills on the floor as soon as it happens. Remove soap buildup in the tub or  shower regularly. Attach bath mats securely with double-sided non-slip rug tape. Do not have throw rugs and other things on the floor that can make you trip. What can I do in the bedroom? Use night lights. Make sure that you have a light by your bed that is easy to reach. Do not use any sheets or blankets that are too big for your bed. They should not hang down onto the floor. Have a firm chair that has side arms. You can use this for support while you get dressed. Do not have throw rugs and other things on the floor that can make you trip. What can I do in the kitchen? Clean up any spills right away. Avoid walking on wet floors. Keep items that you use a lot in easy-to-reach places. If you need to reach something above you, use a strong step stool that has a grab bar. Keep electrical cords out of the way. Do not use floor polish or wax that makes floors slippery. If you must use wax, use non-skid floor wax. Do not have throw rugs and other things on the floor that can make you trip. What can I do with my stairs? Do not leave any items on the stairs. Make sure that there are handrails on both sides of the stairs and use them. Fix handrails that are broken or loose. Make sure that handrails are  as long as the stairways. Check any carpeting to make sure that it is firmly attached to the stairs. Fix any carpet that is loose or worn. Avoid having throw rugs at the top or bottom of the stairs. If you do have throw rugs, attach them to the floor with carpet tape. Make sure that you have a light switch at the top of the stairs and the bottom of the stairs. If you do not have them, ask someone to add them for you. What else can I do to help prevent falls? Wear shoes that: Do not have high heels. Have rubber bottoms. Are comfortable and fit you well. Are closed at the toe. Do not wear sandals. If you use a stepladder: Make sure that it is fully opened. Do not climb a closed stepladder. Make  sure that both sides of the stepladder are locked into place. Ask someone to hold it for you, if possible. Clearly mark and make sure that you can see: Any grab bars or handrails. First and last steps. Where the edge of each step is. Use tools that help you move around (mobility aids) if they are needed. These include: Canes. Walkers. Scooters. Crutches. Turn on the lights when you go into a dark area. Replace any light bulbs as soon as they burn out. Set up your furniture so you have a clear path. Avoid moving your furniture around. If any of your floors are uneven, fix them. If there are any pets around you, be aware of where they are. Review your medicines with your doctor. Some medicines can make you feel dizzy. This can increase your chance of falling. Ask your doctor what other things that you can do to help prevent falls. This information is not intended to replace advice given to you by your health care provider. Make sure you discuss any questions you have with your health care provider. Document Released: 05/17/2009 Document Revised: 12/27/2015 Document Reviewed: 08/25/2014 Elsevier Interactive Patient Education  2017 [employer].

## 2021-09-19 ENCOUNTER — Ambulatory Visit (HOSPITAL_COMMUNITY): Payer: Medicare Other

## 2021-09-19 ENCOUNTER — Other Ambulatory Visit: Payer: Self-pay | Admitting: Family Medicine

## 2021-10-10 NOTE — Progress Notes (Signed)
Chronic Care Management Pharmacy Note   Summary:  No changes necessary - moving patient to as needed follow ups.  10/14/2021 Name:  Thomas Craig MRN:  [id number] DOB:  Dec 30, 1945  Subjective: Thomas Craig is an 76 y.o. year old male who is a primary patient of Hunter, Brayton Mars, MD.  The CCM team was consulted for assistance with disease management and care coordination needs.   Engaged with patient by telephone for follow up visit in response to provider referral for pharmacy case management and/or care coordination services.   Consent to Services:  The patient was given information about Chronic Care Management services, agreed to services, and gave verbal consent prior to initiation of services.  Please see initial visit note for detailed documentation.  Patient Care Team: Marin Olp, MD as PCP - General (Family Medicine) Edythe Clarity, [facility] (Pharmacist)  Recent office visits:  12/13/20-Thomas Yong Channel MD (PCP)- Office visit for management of chronic conditions. No medication changes. Follow up in 6 months.    Recent consult visits:  None   Hospital visits:  None in previous 6 months  Objective: Lab Results  Component Value Date   CREATININE 0.95 12/13/2020   BUN 18 12/13/2020   GFR 78.93 12/13/2020   GFRNONAA >60 11/22/2017   GFRAA >60 11/22/2017   NA 131 (L) 12/13/2020   K 3.9 12/13/2020   CALCIUM 9.6 12/13/2020   CO2 30 12/13/2020   Lab Results  Component Value Date/Time   GFR 78.93 12/13/2020 02:46 PM   GFR 82.69 09/15/2019 02:29 PM    Last diabetic Eye exam: No results found for: HMDIABEYEEXA  Last diabetic Foot exam: No results found for: HMDIABFOOTEX  Lab Results  Component Value Date   CHOL 237 (H) 12/13/2020   HDL 105.00 12/13/2020   LDLCALC 118 (H) 12/13/2020   LDLDIRECT 100.5 10/27/2011   TRIG 71.0 12/13/2020   CHOLHDL 2 12/13/2020   Hepatic Function Latest Ref Rng & Units 12/13/2020 09/15/2019 09/15/2018  Total Protein 6.0 -  8.3 g/dL 6.9 7.0 6.0  Albumin 3.5 - 5.2 g/dL 4.6 4.6 3.9  AST 0 - 37 U/L _0 ALT 0 - 53 U/L _1 Alk Phosphatase 39 - 117 U/L 29(L) 33(L) 27(L)  Total Bilirubin 0.2 - 1.2 mg/dL 0.8 0.9 0.8  Bilirubin, Direct 0.0 - 0.3 mg/dL - - -   Lab Results  Component Value Date/Time   TSH 2.40 09/03/2015 08:05 AM   TSH 1.98 10/11/2012 08:41 AM   CBC Latest Ref Rng & Units 12/13/2020 09/15/2019 09/15/2018  WBC 4.0 - 10.5 K/uL 5.8 7.4 3.2(L)  Hemoglobin 13.0 - 17.0 g/dL 14.0 14.0 13.3  Hematocrit 39.0 - 52.0 % 39.6 40.6 38.5(L)  Platelets 150.0 - 400.0 K/uL 190.0 191.0 188.0   No results found for: VD25OH  Clinical ASCVD: No  The ASCVD Risk score (Arnett DK, et al., 2019) failed to calculate for the following reasons:   The valid HDL cholesterol range is 20 to 100 mg/dL    CAC 10/2019: 32 (23rd percentile for age/sex matched control)  Aorta US 09/2020: Abdominal Aorta: No evidence of an abdominal aortic aneurysm was visualized. The largest aortic measurement is 2.8 cm. The bilateral common iliac arteries appear ectatic, with the right measuring 1.7 cm and the left measuring 1.8 cm. Stenosis: No signifcant aorta-iliac atherosclerosis or stenosis noted.   Depression screen [facility] 2/9 09/09/2021 06/25/2021 12/13/2020  Decreased Interest 0 0 0  Down, Depressed, Hopeless 0  0 0  PHQ - 2 Score 0 0 0  Altered sleeping - - 0  Tired, decreased energy - - 0  Change in appetite - - 0  Feeling bad or failure about yourself  - - 0  Trouble concentrating - - 0  Moving slowly or fidgety/restless - - 0  Suicidal thoughts - - 0  PHQ-9 Score - - 0  Difficult doing work/chores - - Not difficult at all    Social History   Tobacco Use  Smoking Status Former   Packs/day: 0.75   Years: 5.00   Pack years: 3.75   Types: Cigarettes   Quit date: 08/05/1975   Years since quitting: 46.2  Smokeless Tobacco Never  Tobacco Comments   ONLY SMOKED FOR 5 Y EARS or less with limited cigarettes   BP Readings from  Last 3 Encounters:  08/07/21 105/68  06/25/21 (!) 158/82  12/13/20 138/82   Pulse Readings from Last 3 Encounters:  08/07/21 (!) 53  06/25/21 74  12/13/20 83   Wt Readings from Last 3 Encounters:  08/07/21 148 lb (67.1 kg)  07/24/21 148 lb (67.1 kg)  06/25/21 146 lb 3.2 oz (66.3 kg)   Assessment/Interventions: Review of patient past medical history, allergies, medications, health status, including review of consultants reports, laboratory and other test data, was performed as part of comprehensive evaluation and provision of chronic care management services.   SDOH:  (Social Determinants of Health) assessments and interventions performed: Yes  CCM Care Plan Allergies  Allergen Reactions   Sulfa Antibiotics    Medications Reviewed Today     Reviewed by Edythe Clarity, [facility] (Pharmacist) on 10/14/21 at [place] List Status: <None>   Medication Order Taking? Sig Documenting Provider Last Dose Status Informant  amLODipine (NORVASC) 5 MG tablet [id number] Yes Take 1 tablet (5 mg total) by mouth daily. Marin Olp, MD Taking Active   Ascorbic Acid (VITAMIN C) 1000 MG tablet [id number] Yes Take 3,000 mg by mouth daily.  [provider] Taking Active   aspirin EC 81 MG tablet [id number] Yes Take 81 mg by mouth daily. Swallow whole. [provider] Taking Active Self  B Complex-C-Folic Acid (STRESS B COMPLEX PO) [id number] Yes Take by mouth. [provider] Taking Active   cyanocobalamin 500 MCG tablet [id number] Yes Take 500 mcg by mouth daily. [provider] Taking Active   doxylamine, Sleep, (UNISOM) 25 MG tablet [id number] Yes Take 25 mg by mouth at bedtime as needed. [provider] Taking Active   Glucosamine-Chondroit-Vit C-Mn (GLUCOSAMINE CHONDR [place]) CAPS [id number] Yes Take 3-4 capsules by mouth daily.  [provider] Taking Active   Multiple Vitamin (MULTIVITAMIN) tablet [id number] Yes Take 1 tablet by mouth daily.  Contains iron [provider] Taking Active   multivitamin-lutein [facility]) CAPS [id number] Yes Take 1 capsule by mouth daily. [provider] Taking Active   Omega-3 Fatty Acids (OMEGA-3 PO) [id number] Yes Take 4 capsules by mouth daily. [provider] Taking Active   Saw Palmetto, Serenoa repens, 1000 MG CAPS [id number] Yes Take 2 capsules by mouth daily. [provider] Taking Active            Patient Active Problem List   Diagnosis Date Noted   History of adenomatous polyp of colon 02/23/2018   History of hematuria 06/19/2016   History of melanoma 09/05/2014   BPH (benign prostatic hyperplasia) 09/05/2014   Essential hypertension 09/05/2014   Hyperlipidemia 09/05/2014   Bunion of great  toe of left foot 10/20/2011   Immunization History  Administered Date(s) Administered   Fluad Quad(high Dose 65+) 06/14/2020, 05/30/2021   Hepatitis A 04/20/2009, 12/28/2014   Influenza Whole 07/04/2009   Influenza, High Dose Seasonal PF 03/30/2019   Influenza,inj,Quad PF,6+ Mos 04/20/2013, 03/19/2016, 05/25/2017   Influenza-Unspecified 05/16/2014, 05/16/2015, 06/18/2018   Moderna Sars-Covid-2 Vaccination 01/15/2021, 07/09/2021   PFIZER(Purple Top)SARS-COV-2 Vaccination 08/24/2019, 09/14/2019, 04/14/2020   Pneumococcal Conjugate-13 09/05/2014   Pneumococcal Polysaccharide-23 10/18/2012   Td 08/04/2005   Tdap 09/20/2015   Zoster, Live 10/18/2012   Conditions to be addressed/monitored:  Hypertension and Hyperlipidemia Care Plan : [place]  Updates made by Edythe Clarity, RPH since 10/14/2021 12:00 AM     Problem: HLD HTN      Long-Range Goal: Disease management   Start Date: 10/02/2020  Expected End Date: 10/02/2021  Recent Progress: On track  Priority: High  Note:      Current Barriers:  Suboptimal control of cholesterol  Pharmacist Clinical Goal(s):  Over the next 365 days, patient will verbalize ability to afford treatment  regimen achieve adherence to monitoring guidelines and medication adherence to achieve therapeutic efficacy contact provider office for questions/concerns as evidenced notation of same in electronic health record through collaboration with PharmD and provider.   Interventions: 1:1 collaboration with Marin Olp, MD regarding development and update of comprehensive plan of care as evidenced by provider attestation and co-signature Inter-disciplinary care team collaboration (see longitudinal plan of care) Comprehensive medication review performed; medication list updated in electronic medical record  Hypertension (BP goal <130/80) -Controlled  -Current treatment: Amlodipine 5 mg once daily Appropriate, Effective, Safe, Accessible -Current home readings: at/near goal at home -Current dietary habits: cooks 2-4 meals per week. Fish or chicken, white meat, all fresh vegetables/fruits. Fruits are main snack. -Current exercise habits: 20-25 miles per week. -Denies hypotensive/hypertensive symptoms -Educated on BP goals and benefits of medications for prevention of heart attack, stroke and kidney damage; Daily salt intake goal < 2300 mg; Exercise goal of 150 minutes per week; -Counseled to monitor BP at home, document, and provide log at future appointments -Counseled on diet and exercise extensively  Update 04/16/21 127/70 avg for last 100 readings Running 25 miles per day now - training for half marathon in [place] the first weekend of November. Denies any dizziness, occasional swelling that resolves with feet elevation. Adherent with med - no concerns at this time. Continue meds as current.  10/14/21 [employer] half marathon!  Cold and rainy but he finished and felt in good shape.  BP continues to be controlled.  Continues active lifestyle and running - helping a friend train for half marathon at this time. No changes, continue as previousl  Hyperlipidemia: (LDL goal <100  without medications) -Not ideally controlled -Current treatment: N/a at this time -ASA for colon (hx of polyps)/CV health -AAA/CAD screenings lower risk, done within last 2 years -Despite ASCVD 10-year risk >20% feels he would be hesitant to start statin due to consistently low total/hdl cholesterol ratio. -Current dietary patterns: see HTN -Current exercise habits: see HTN -Educated on Cholesterol goals;  Benefits of statin for ASCVD risk reduction; Importance of limiting foods high in cholesterol; Exercise goal of 150 minutes per week; -Counseled on diet and exercise extensively -RPH to Review ASCVD risk and statin use at 6 month telephone visit  Update 04/16/21 Per discussion with Dr. Yong Craig they have decided to hold off on statin medication.  Patient has great HDL and active lifestyle.  Will continue yearly labs to follow. Continue healthy lifestyle.  Update 10/14/21 Upcoming appointment with PCP in May.  Recommend repeat lipid panel.   Patient Goals/Self-Care Activities Over the next 365 days, patient will:  - take medications as prescribed target a minimum of 150 minutes of moderate intensity exercise weekly        Medication Assistance: None required.  Patient affirms current coverage meets needs. Patient's preferred pharmacy is:  [facility], [place] [address] Phone: [phone] Fax: [phone]  Patient decided to: Continue current medication management strategy Patient agrees to Care Plan and Follow-up. Future Appointments  Date Time Provider [place]  12/24/2021  1:00 PM Marin Olp, MD [facility]  09/22/2022  1:45 PM [facility] HEALTH COACH [facility]   Follow-up plan with Care Management Team: FU as needed  Beverly Milch, PharmD Clinical Pharmacist [phone]

## 2021-10-14 ENCOUNTER — Ambulatory Visit (INDEPENDENT_AMBULATORY_CARE_PROVIDER_SITE_OTHER): Payer: Medicare Other | Admitting: Pharmacist

## 2021-10-14 DIAGNOSIS — E785 Hyperlipidemia, unspecified: Secondary | ICD-10-CM

## 2021-10-14 DIAGNOSIS — I10 Essential (primary) hypertension: Secondary | ICD-10-CM

## 2021-10-14 NOTE — Patient Instructions (Signed)
Visit Information ? ? Goals Addressed   ?None ?  ? ?Patient Care Plan: [place]  ?  ? ?Problem Identified: HLD HTN   ?  ? ?Long-Range Goal: Disease management   ?Start Date: 10/02/2020  ?Expected End Date: 10/02/2021  ?Recent Progress: On track  ?Priority: High  ?Note:   ? Current Barriers:  ?Suboptimal control of cholesterol ? ?Pharmacist Clinical Goal(s):  ?Over the next 365 days, patient will verbalize ability to afford treatment regimen ?achieve adherence to monitoring guidelines and medication adherence to achieve therapeutic efficacy ?contact provider office for questions/concerns as evidenced notation of same in electronic health record through collaboration with PharmD and provider.  ? ?Interventions: ?1:1 collaboration with Marin Olp, MD regarding development and update of comprehensive plan of care as evidenced by provider attestation and co-signature ?Inter-disciplinary care team collaboration (see longitudinal plan of care) ?Comprehensive medication review performed; medication list updated in electronic medical record ? ?Hypertension (BP goal <130/80) ?-Controlled  ?-Current treatment: ?Amlodipine 5 mg once daily ?-Current home readings: at/near goal at home ?-Current dietary habits: cooks 2-4 meals per week. Fish or chicken, white meat, all fresh vegetables/fruits. Fruits are main snack. ?-Current exercise habits: 20-25 miles per week. ?-Denies hypotensive/hypertensive symptoms ?-Educated on BP goals and benefits of medications for prevention of heart attack, stroke and kidney damage; ?Daily salt intake goal < 2300 mg; ?Exercise goal of 150 minutes per week; ?-Counseled to monitor BP at home, document, and provide log at future appointments ?-Counseled on diet and exercise extensively ? ?Update 04/16/21 ?127/70 avg for last 100 readings ?Running 25 miles per day now - training for half marathon in [place] the first weekend of November. ?Denies any dizziness, occasional swelling that  resolves with feet elevation. ?Adherent with med - no concerns at this time. ? ?Continue meds as current. ? ?Hyperlipidemia: (LDL goal <100 without medications) ?-Not ideally controlled ?-Current treatment: ?N/a at this time ?-ASA for colon (hx of polyps)/CV health ?-AAA/CAD screenings lower risk, done within last 2 years ?-Despite ASCVD 10-year risk >20% feels he would be hesitant to start statin due to consistently low total/hdl cholesterol ratio. ?-Current dietary patterns: see HTN ?-Current exercise habits: see HTN ?-Educated on Cholesterol goals;  ?Benefits of statin for ASCVD risk reduction; ?Importance of limiting foods high in cholesterol; ?Exercise goal of 150 minutes per week; ?-Counseled on diet and exercise extensively ?-RPH to Review ASCVD risk and statin use at 6 month telephone visit ? ?Update 04/16/21 ?Per discussion with Dr. Yong Channel they have decided to hold off on statin medication.  Patient has great HDL and active lifestyle.  Will continue yearly labs to follow. ? ?Continue healthy lifestyle. ? ?Health Maintenance ?Patient due for colonoscopy, he has already reached out to Dr. Yong Channel to schedule/refer for appointment. ? ?Patient Goals/Self-Care Activities ?Over the next 180 days, patient will:  ?- take medications as prescribed ?target a minimum of 150 minutes of moderate intensity exercise weekly ?  ?  ? ?The patient verbalized understanding of instructions, educational materials, and care plan provided today and declined offer to receive copy of patient instructions, educational materials, and care plan.  ?Telephone follow up appointment with pharmacy team member scheduled for: As needed ? ?Edythe Clarity, [facility]  ?congenital dislocation of the hip ? ?

## 2021-11-01 DIAGNOSIS — E785 Hyperlipidemia, unspecified: Secondary | ICD-10-CM

## 2021-11-01 DIAGNOSIS — I10 Essential (primary) hypertension: Secondary | ICD-10-CM | POA: Diagnosis not present

## 2021-11-08 ENCOUNTER — Encounter: Payer: Self-pay | Admitting: Family Medicine

## 2021-12-24 ENCOUNTER — Encounter: Payer: Self-pay | Admitting: Family Medicine

## 2021-12-24 ENCOUNTER — Ambulatory Visit (INDEPENDENT_AMBULATORY_CARE_PROVIDER_SITE_OTHER): Payer: Medicare Other | Admitting: Family Medicine

## 2021-12-24 VITALS — BP 119/54 | HR 74 | Temp 98.4°F | Ht 66.0 in | Wt 144.0 lb

## 2021-12-24 DIAGNOSIS — R351 Nocturia: Secondary | ICD-10-CM | POA: Diagnosis not present

## 2021-12-24 DIAGNOSIS — I10 Essential (primary) hypertension: Secondary | ICD-10-CM | POA: Diagnosis not present

## 2021-12-24 DIAGNOSIS — I77811 Abdominal aortic ectasia: Secondary | ICD-10-CM

## 2021-12-24 DIAGNOSIS — E785 Hyperlipidemia, unspecified: Secondary | ICD-10-CM

## 2021-12-24 LAB — CBC WITH DIFFERENTIAL/PLATELET
Basophils Absolute: 0 10*3/uL (ref 0.0–0.1)
Basophils Relative: 0.5 % (ref 0.0–3.0)
Eosinophils Absolute: 0 10*3/uL (ref 0.0–0.7)
Eosinophils Relative: 0.5 % (ref 0.0–5.0)
HCT: 39.6 % (ref 39.0–52.0)
Hemoglobin: 13.7 g/dL (ref 13.0–17.0)
Lymphocytes Relative: 19.4 % (ref 12.0–46.0)
Lymphs Abs: 1.3 10*3/uL (ref 0.7–4.0)
MCHC: 34.7 g/dL (ref 30.0–36.0)
MCV: 94.4 fl (ref 78.0–100.0)
Monocytes Absolute: 0.4 10*3/uL (ref 0.1–1.0)
Monocytes Relative: 6.3 % (ref 3.0–12.0)
Neutro Abs: 4.9 10*3/uL (ref 1.4–7.7)
Neutrophils Relative %: 73.3 % (ref 43.0–77.0)
Platelets: 186 10*3/uL (ref 150.0–400.0)
RBC: 4.2 Mil/uL — ABNORMAL LOW (ref 4.22–5.81)
RDW: 13.1 % (ref 11.5–15.5)
WBC: 6.7 10*3/uL (ref 4.0–10.5)

## 2021-12-24 LAB — COMPREHENSIVE METABOLIC PANEL
ALT: 36 U/L (ref 0–53)
AST: 37 U/L (ref 0–37)
Albumin: 4.8 g/dL (ref 3.5–5.2)
Alkaline Phosphatase: 36 U/L — ABNORMAL LOW (ref 39–117)
BUN: 20 mg/dL (ref 6–23)
CO2: 27 mEq/L (ref 19–32)
Calcium: 9.9 mg/dL (ref 8.4–10.5)
Chloride: 95 mEq/L — ABNORMAL LOW (ref 96–112)
Creatinine, Ser: 1.03 mg/dL (ref 0.40–1.50)
GFR: 71.12 mL/min (ref >60.00)
Glucose, Bld: 79 mg/dL (ref 70–99)
Potassium: 4.3 mEq/L (ref 3.5–5.1)
Sodium: 133 mEq/L — ABNORMAL LOW (ref 135–145)
Total Bilirubin: 1.1 mg/dL (ref 0.2–1.2)
Total Protein: 7.4 g/dL (ref 6.0–8.3)

## 2021-12-24 LAB — LIPID PANEL
Cholesterol: 251 mg/dL — ABNORMAL HIGH (ref 0–200)
HDL: 107 mg/dL (ref >39.00)
LDL Cholesterol: 132 mg/dL — ABNORMAL HIGH (ref 0–99)
NonHDL: 144.27
Total CHOL/HDL Ratio: 2
Triglycerides: 59 mg/dL (ref 0.0–149.0)
VLDL: 11.8 mg/dL (ref 0.0–40.0)

## 2021-12-24 LAB — PSA: PSA: 4.17 ng/mL — ABNORMAL HIGH (ref 0.10–4.00)

## 2021-12-24 NOTE — Patient Instructions (Addendum)
Please stop by lab before you go If you have mychart- we will send your results within 3 business days of [place] receiving them.  If you do not have mychart- we will call you about results within 5 business days of [place] receiving them.  *please also note that you will see labs on mychart as soon as they post. I will later go in and write notes on them- will say "notes from Dr. Yong Channel"   Recommended follow up: Return in about 9 months (around 09/26/2022) for followup or sooner if needed.Schedule b4 you leave.

## 2021-12-24 NOTE — Progress Notes (Signed)
Phone [phone] In person visit   Subjective:   Thomas Craig is a 76 y.o. year old very pleasant male patient who presents for/with See problem oriented charting Chief Complaint  Patient presents with   Hypertension   Hyperlipidemia    Past Medical History-  Patient Active Problem List   Diagnosis Date Noted   History of melanoma 09/05/2014    Priority: High   Ectatic abdominal aorta ([place]) 12/24/2021    Priority: Medium    History of adenomatous polyp of colon 02/23/2018    Priority: Medium    BPH (benign prostatic hyperplasia) 09/05/2014    Priority: Medium    Essential hypertension 09/05/2014    Priority: Medium    Hyperlipidemia 09/05/2014    Priority: Medium    History of hematuria 06/19/2016    Priority: Low   Bunion of great toe of left foot 10/20/2011    Priority: Low    Medications- reviewed and updated Current Outpatient Medications  Medication Sig Dispense Refill   amLODipine (NORVASC) 5 MG tablet Take 1 tablet (5 mg total) by mouth daily. 90 tablet 3   Ascorbic Acid (VITAMIN C) 1000 MG tablet Take 3,000 mg by mouth daily.      aspirin EC 81 MG tablet Take 81 mg by mouth daily. Swallow whole.     B Complex-C-Folic Acid (STRESS B COMPLEX PO) Take by mouth.     cyanocobalamin 500 MCG tablet Take 500 mcg by mouth daily.     doxylamine, Sleep, (UNISOM) 25 MG tablet Take 25 mg by mouth at bedtime as needed.     Glucosamine-Chondroit-Vit C-Mn (GLUCOSAMINE CHONDR 1500 COMPLX) CAPS Take 3-4 capsules by mouth daily.      Multiple Vitamin (MULTIVITAMIN) tablet Take 1 tablet by mouth daily. Contains iron     multivitamin-lutein (OCUVITE-LUTEIN) CAPS Take 1 capsule by mouth daily.     Omega-3 Fatty Acids (OMEGA-3 PO) Take 4 capsules by mouth daily.     Saw Palmetto, Serenoa repens, 1000 MG CAPS Take 2 capsules by mouth daily.     No current facility-administered medications for this visit.     Objective:  BP (!) 119/54 Comment: most recent home reading   Pulse 74   Temp 98.4 F (36.9 C) (Temporal)   Ht '5\' 6"'$  (1.676 m)   Wt 144 lb (65.3 kg)   SpO2 96%   BMI 23.24 kg/m  Gen: NAD, resting comfortably CV: RRR no murmurs rubs or gallops Lungs: CTAB no crackles, wheeze, rhonchi Abdomen: soft/nontender/nondistended/normal bowel sounds. No rebound or guarding.  Ext: no edema Skin: warm, dry Neuro: grossly normal, moves all extremities    Assessment and Plan   #hypertension with whitecoat element S: medication: Amlodipine 5 mg Home readings #s:  average 128/67 over last 100 readings. In last week as low as  110s/50s- highest 130s -Patient continues to run 25 miles a week- in a few group runs up to 8 or 9 on Saturday.   BP Readings from Last 3 Encounters:  12/24/21 (!) 119/54 most recent home reading  08/07/21 105/68  06/25/21 (!) 158/82   A/P: Excellent control of blood pressure at home-continue current medication.  Continues have mild elevations in the office on both heart rate and blood pressure - white coat element   #hyperlipidemia S: Medication:Aspirin 81 mg, omega-3-his preference has been to remain off statin - Mild elevations on coronary calcium score of 32-which was 23rd percentile for age.he did stop prior calcium pill prior to 2021 scan.  -trying  to improve on plant based diet. Red meat only once every 2 weeks.  Lab Results  Component Value Date   CHOL 237 (H) 12/13/2020   HDL 105.00 12/13/2020   LDLCALC 118 (H) 12/13/2020   LDLDIRECT 100.5 10/27/2011   TRIG 71.0 12/13/2020   CHOLHDL 2 12/13/2020   A/P: We will update lipid panel-unlikely to start statin unless significant increase in levels per his baseline preference  #History of melanoma- follows with dermatology annually   #History of adenomatous polyp of the colon-08/07/21 with 3-year repeat planned   #Prostate cancer screening-patient prefers to continue to monitor with BPH with nocturia about once a night- better from twice a day cutting down on fluids before  bed Lab Results  Component Value Date   PSA 1.81 02/14/2021   PSA 2.68 12/13/2020   PSA 1.90 09/15/2019   #Abdominal aortic ectasia (iliac artery technically)-noted on AAA screening on 09/12/2020 with 2-year repeat planned    #Health maintenance -planned for final shingrix may 30th at [facility] [place] Immunization History  Administered Date(s) Administered   Fluad Quad(high Dose 65+) 06/14/2020, 05/30/2021   Hepatitis A 04/20/2009, 12/28/2014   Influenza Whole 07/04/2009   Influenza, High Dose Seasonal PF 03/30/2019   Influenza,inj,Quad PF,6+ Mos 04/20/2013, 03/19/2016, 05/25/2017   Influenza-Unspecified 05/16/2014, 05/16/2015, 06/18/2018   Moderna Sars-Covid-2 Vaccination 01/15/2021, 07/09/2021   PFIZER(Purple Top)SARS-COV-2 Vaccination 08/24/2019, 09/14/2019, 04/14/2020   Pneumococcal Conjugate-13 09/05/2014   Pneumococcal Polysaccharide-23 10/18/2012   Td 08/04/2005   Tdap 09/20/2015   Zoster Recombinat (Shingrix) 10/30/2021   Zoster, Live 10/18/2012    Recommended follow up: Return in about 9 months (around 09/26/2022) for followup or sooner if needed.Schedule b4 you leave. Future Appointments  Date Time Provider [place]  09/22/2022  1:45 PM [facility] HEALTH COACH [facility]    Lab/Order associations: fasting   ICD-10-CM   1. Essential hypertension  I10 CBC with Differential/Platelet    Comprehensive metabolic panel    Lipid panel    2. Hyperlipidemia, unspecified hyperlipidemia type  E78.5 CBC with Differential/Platelet    Comprehensive metabolic panel    Lipid panel    3. Nocturia  R35.1 PSA    4. Ectatic abdominal aorta (HCC)  I77.811       No orders of the defined types were placed in this encounter.   Return precautions advised.  Garret Reddish, MD

## 2021-12-25 ENCOUNTER — Encounter: Payer: Self-pay | Admitting: Family Medicine

## 2021-12-25 ENCOUNTER — Other Ambulatory Visit: Payer: Self-pay

## 2021-12-25 DIAGNOSIS — R972 Elevated prostate specific antigen [PSA]: Secondary | ICD-10-CM

## 2021-12-25 NOTE — Telephone Encounter (Signed)
See below

## 2022-01-10 ENCOUNTER — Encounter: Payer: Self-pay | Admitting: Family Medicine

## 2022-01-22 ENCOUNTER — Other Ambulatory Visit (INDEPENDENT_AMBULATORY_CARE_PROVIDER_SITE_OTHER): Payer: Medicare Other

## 2022-01-22 DIAGNOSIS — R972 Elevated prostate specific antigen [PSA]: Secondary | ICD-10-CM

## 2022-01-22 LAB — PSA: PSA: 2.89 ng/mL (ref 0.10–4.00)

## 2022-01-29 NOTE — Telephone Encounter (Signed)
Left patient vm to call back to schedule appt and to give Dr. Ansel Bong response.

## 2022-02-07 ENCOUNTER — Encounter: Payer: Self-pay | Admitting: Family Medicine

## 2022-02-19 ENCOUNTER — Encounter: Payer: Self-pay | Admitting: Family Medicine

## 2022-02-19 ENCOUNTER — Ambulatory Visit (INDEPENDENT_AMBULATORY_CARE_PROVIDER_SITE_OTHER): Payer: Medicare Other | Admitting: Family Medicine

## 2022-02-19 VITALS — BP 128/72 | HR 67 | Temp 98.2°F | Ht 66.0 in | Wt 145.2 lb

## 2022-02-19 DIAGNOSIS — R351 Nocturia: Secondary | ICD-10-CM

## 2022-02-19 DIAGNOSIS — I10 Essential (primary) hypertension: Secondary | ICD-10-CM | POA: Diagnosis not present

## 2022-02-19 DIAGNOSIS — R3129 Other microscopic hematuria: Secondary | ICD-10-CM

## 2022-02-19 DIAGNOSIS — N401 Enlarged prostate with lower urinary tract symptoms: Secondary | ICD-10-CM

## 2022-02-19 DIAGNOSIS — Z87448 Personal history of other diseases of urinary system: Secondary | ICD-10-CM | POA: Diagnosis not present

## 2022-02-19 DIAGNOSIS — R972 Elevated prostate specific antigen [PSA]: Secondary | ICD-10-CM

## 2022-02-19 LAB — URINALYSIS, MICROSCOPIC ONLY

## 2022-02-19 NOTE — Patient Instructions (Addendum)
No changes today-lets get a follow up urine with history of blood in urine in 2017 but thankfully no issues since then  Recommended follow up: Return for next already scheduled visit or sooner if needed.

## 2022-02-19 NOTE — Progress Notes (Signed)
Phone [phone] In person visit   Subjective:   Thomas Craig is a 76 y.o. year old very pleasant male patient who presents for/with See problem oriented charting Chief Complaint  Patient presents with   Discuss Urology    Pt states PSA was normal on repeat and wants to discuss if urology visit is necessary.   Past Medical History-  Patient Active Problem List   Diagnosis Date Noted   History of melanoma 09/05/2014    Priority: High   Ectatic abdominal aorta ([place]) 12/24/2021    Priority: Medium    History of adenomatous polyp of colon 02/23/2018    Priority: Medium    BPH (benign prostatic hyperplasia) 09/05/2014    Priority: Medium    Essential hypertension 09/05/2014    Priority: Medium    Hyperlipidemia 09/05/2014    Priority: Medium    History of hematuria 06/19/2016    Priority: Low   Bunion of great toe of left foot 10/20/2011    Priority: Low    Medications- reviewed and updated Current Outpatient Medications  Medication Sig Dispense Refill   amLODipine (NORVASC) 5 MG tablet Take 1 tablet (5 mg total) by mouth daily. 90 tablet 3   Ascorbic Acid (VITAMIN C) 1000 MG tablet Take 3,000 mg by mouth daily.      aspirin EC 81 MG tablet Take 81 mg by mouth daily. Swallow whole.     B Complex-C-Folic Acid (STRESS B COMPLEX PO) Take by mouth.     cyanocobalamin 500 MCG tablet Take 500 mcg by mouth daily.     doxylamine, Sleep, (UNISOM) 25 MG tablet Take 25 mg by mouth at bedtime as needed.     Glucosamine-Chondroit-Vit C-Mn (GLUCOSAMINE CHONDR 1500 COMPLX) CAPS Take 3-4 capsules by mouth daily.      Multiple Vitamin (MULTIVITAMIN) tablet Take 1 tablet by mouth daily. Contains iron     multivitamin-lutein (OCUVITE-LUTEIN) CAPS Take 1 capsule by mouth daily.     Omega-3 Fatty Acids (OMEGA-3 PO) Take 4 capsules by mouth daily.     Saw Palmetto, Serenoa repens, 1000 MG CAPS Take 2 capsules by mouth daily.     No current facility-administered medications for this  visit.     Objective:  BP 128/72   Pulse 67   Temp 98.2 F (36.8 C)   Ht '5\' 6"'$  (1.676 m)   Wt 145 lb 3.2 oz (65.9 kg)   SpO2 99%   BMI 23.44 kg/m  Gen: NAD, resting comfortably Rectal: normal tone, diffusely enlarged prostate, no masses or tenderness     Assessment and Plan   # elevated PSA and patient with BPH history S:Medication: saw palmetto - patient with BPH and nocturia twice a night -01/23/22 PSA has trended down but is still up above baseline around 1.7-1.9 in last few years- I think itd be reasonable to refer to urology under elevated PSA  - team please refer him if he agrees  Component     Latest Ref Rng 01/07/2018 09/15/2018 09/15/2019 12/13/2020 02/14/2021  PSA     0.10 - 4.00 ng/mL 1.54  1.73  1.90  2.68  1.81    Component     Latest Ref Rng 12/24/2021 01/22/2022  PSA     0.10 - 4.00 ng/mL 4.17 (H)  2.89    A/P: We had a good discussion today about PSA variation.  Last year had a temporary PSA increase but improved on repeat-this year had a more significant increase and trended down but not  as much as I would like-I discussed my reasoning for initially recommending urology referral.  Discussed with increased from baseline in the 1.8-1.9 range up to 2.89 I was concerned about potential prostate cancer. -- has changed saw palmetto and before doing urology visit would like to change back and see if psa trends down- wants to hold off on psa today.  We discussed I do not have clear evidence that changing saw palmetto brands will change PSA at all - He understands there is some risk of delaying diagnosis if there is underlying prostate cancer but we also discussed this could be related to BPH-smooth but large prostate on exam today -Ultimately he decided for repeat PSA next March-ideally avoid vigorous exercise or sex before visit-we also discussed that exam itself could raise PSA-we will decide at next visit whether to complete digital rectal -BPH overall appears stable with  nocturia stable at 2 times a night from around 2017-continue without medication other than over-the-counter saw palmetto per his preference  #History of hematuria-patient with history of gross hematuria in 2017 and refer to urology at that time-he had opted out of cystoscopy and CT scan-last urine was normal in 2019 with no blood-we will order another urine microscopic today [place] another data point that would push [place] towards urology consult if positive for blood  #hypertension S: medication: Amlodipine 5 mg Home readings #s: 128/67 average over last 100 readings BP Readings from Last 3 Encounters:  02/19/22 128/72  12/24/21 (!) 119/54  08/07/21 105/68  A/P: Controlled. Continue current medications.   Recommended follow up: Return for next already scheduled visit or sooner if needed. Future Appointments  Date Time Provider [place]  09/22/2022  1:45 PM [facility]  10/03/2022  1:00 PM Marin Olp, MD [facility]    Lab/Order associations:   ICD-10-CM   1. History of hematuria  Z87.448 Urine Microscopic      No orders of the defined types were placed in this encounter.   Return precautions advised.  Garret Reddish, MD

## 2022-04-09 DIAGNOSIS — Z86006 Personal history of melanoma in-situ: Secondary | ICD-10-CM | POA: Diagnosis not present

## 2022-04-09 DIAGNOSIS — D225 Melanocytic nevi of trunk: Secondary | ICD-10-CM | POA: Diagnosis not present

## 2022-04-09 DIAGNOSIS — Z08 Encounter for follow-up examination after completed treatment for malignant neoplasm: Secondary | ICD-10-CM | POA: Diagnosis not present

## 2022-04-09 DIAGNOSIS — L821 Other seborrheic keratosis: Secondary | ICD-10-CM | POA: Diagnosis not present

## 2022-04-09 DIAGNOSIS — Z85820 Personal history of malignant melanoma of skin: Secondary | ICD-10-CM | POA: Diagnosis not present

## 2022-04-09 DIAGNOSIS — L814 Other melanin hyperpigmentation: Secondary | ICD-10-CM | POA: Diagnosis not present

## 2022-04-09 DIAGNOSIS — Z85828 Personal history of other malignant neoplasm of skin: Secondary | ICD-10-CM | POA: Diagnosis not present

## 2022-04-28 ENCOUNTER — Encounter: Payer: Self-pay | Admitting: *Deleted

## 2022-04-29 DIAGNOSIS — Z23 Encounter for immunization: Secondary | ICD-10-CM | POA: Diagnosis not present

## 2022-05-06 ENCOUNTER — Encounter: Payer: Self-pay | Admitting: Family Medicine

## 2022-05-27 DIAGNOSIS — H2513 Age-related nuclear cataract, bilateral: Secondary | ICD-10-CM | POA: Diagnosis not present

## 2022-05-27 DIAGNOSIS — H5213 Myopia, bilateral: Secondary | ICD-10-CM | POA: Diagnosis not present

## 2022-05-27 DIAGNOSIS — H31003 Unspecified chorioretinal scars, bilateral: Secondary | ICD-10-CM | POA: Diagnosis not present

## 2022-06-17 ENCOUNTER — Encounter: Payer: Self-pay | Admitting: Family Medicine

## 2022-06-17 NOTE — Telephone Encounter (Signed)
Vaccine documented.

## 2022-09-08 ENCOUNTER — Other Ambulatory Visit: Payer: Self-pay | Admitting: Family Medicine

## 2022-09-30 ENCOUNTER — Ambulatory Visit (INDEPENDENT_AMBULATORY_CARE_PROVIDER_SITE_OTHER): Payer: Medicare Other

## 2022-09-30 VITALS — Wt 145.0 lb

## 2022-09-30 DIAGNOSIS — Z0000 Encounter for general adult medical examination without abnormal findings: Secondary | ICD-10-CM

## 2022-09-30 NOTE — Progress Notes (Signed)
I connected with  [name] on 09/30/22 by a audio enabled telemedicine application and verified that I am speaking with the correct person using two identifiers.  Patient Location: Home  Provider Location: Office/Clinic  I discussed the limitations of evaluation and management by telemedicine. The patient expressed understanding and agreed to proceed.   Subjective:   Thomas Craig is a 77 y.o. male who presents for Medicare Annual/Subsequent preventive examination.  Review of Systems     Cardiac Risk Factors include: advanced age (>6mn, >>18women);dyslipidemia;hypertension;male gender     Objective:    Today's Vitals   09/30/22 1337  Weight: 145 lb (65.8 kg)   Body mass index is 23.4 kg/m.     09/30/2022    1:46 PM 09/09/2021    1:53 PM 06/10/2019    3:55 PM 12/18/2016   10:32 AM  Advanced Directives  Does Patient Have a Medical Advance Directive? Yes Yes Yes Yes  Type of [place]amed[place]of [place] will [place] will [place] will   Does patient want to make changes to medical advance directive?   No - Patient declined   Copy of [place] Chart? No - copy requested No - copy requested No - copy requested     Current Medications (verified) Outpatient Encounter Medications as of 09/30/2022  Medication Sig   amLODipine (NORVASC) 5 MG tablet Take 1 tablet (5 mg total) by mouth daily.   Ascorbic Acid (VITAMIN C) 1000 MG tablet Take 3,000 mg by mouth daily.    B Complex-C-Folic Acid (STRESS B COMPLEX PO) Take by mouth.   cyanocobalamin 500 MCG tablet Take 500 mcg by mouth daily.   doxylamine, Sleep, (UNISOM) 25 MG tablet Take 25 mg by mouth at bedtime as needed.   Glucosamine-Chondroit-Vit C-Mn (GLUCOSAMINE CHONDR 1500 COMPLX) CAPS Take 3-4 capsules by mouth daily.    Multiple Vitamin (MULTIVITAMIN) tablet Take 1 tablet by mouth daily. Contains iron    multivitamin-lutein (OCUVITE-LUTEIN) CAPS Take 1 capsule by mouth daily.   Omega-3 Fatty Acids (OMEGA-3 PO) Take 4 capsules by mouth daily.   saw palmetto 160 MG capsule Take 160 mg by mouth 2 (two) times daily. [employer] prostrate for men   [DISCONTINUED] aspirin EC 81 MG tablet Take 81 mg by mouth daily. Swallow whole.   [DISCONTINUED] Saw Palmetto, Serenoa repens, 1000 MG CAPS Take 2 capsules by mouth daily.   No facility-administered encounter medications on file as of 09/30/2022.    Allergies (verified) Sulfa antibiotics   History: Past Medical History:  Diagnosis Date   BPH (benign prostatic hyperplasia) 09/05/2014   Saw palmetto has improved nocturia 3-5x a night to 1-2x a night.     Bunion of great toe of left foot 10/20/2011   Hallux valgus shift on left with some early changes starting on RT    Hypertension    [place]   Dr. LAllyson Sabal Every 6 month follow up.     Past Surgical History:  Procedure Laterality Date   COLONOSCOPY  10/2007   kaplan - normal   COLONOSCOPY WITH PROPOFOL  02/10/2018   Dr.Armbruster   MELANOMA EXCISION     right arm   POLYPECTOMY     TOOTH EXTRACTION     only 1 wisdom tooth ext   Family History  Problem Relation Age of Onset   Alzheimer's disease Mother        lived to 865  Melanoma Father    Alzheimer's disease Father        lived to 66   Hypertension Father    Anxiety disorder Sister        agoraphobia   Colon cancer Neg Hx    Rectal cancer Neg Hx    Stomach cancer Neg Hx    Colon polyps Neg Hx    Esophageal cancer Neg Hx    Social History   Socioeconomic History   Marital status: Married    Spouse name: Not on file   Number of children: Not on file   Years of education: Not on file   Highest education level: Not on file  Occupational History   Not on file  Tobacco Use   Smoking status: Former    Packs/day: 0.75    Years: 5.00    Total pack years: 3.75    Types: Cigarettes    Quit date: 08/05/1975     Years since quitting: 47.1   Smokeless tobacco: Never   Tobacco comments:    ONLY SMOKED FOR 5 Y EARS or less with limited cigarettes  Vaping Use   Vaping Use: Never used  Substance and Sexual Activity   Alcohol use: Yes    Alcohol/week: 10.0 - 14.0 standard drinks of alcohol    Types: 10 - 14 Standard drinks or equivalent per week    Comment: Beer/wine   Drug use: No   Sexual activity: Yes  Other Topics Concern   Not on file  Social History Narrative   Married 1975 (Wife with another Conservation officer, historic buildings). 3 daughters (38, 36,34 in 2016). 2 granddaughters (only youngest married)      Retired from [employer] for [employer]	[place]. Ran tax group.    Doing some consulting. Increase exercise. Time with dogs. Started back with rotary club). Traveling more.  Marathon October 2017 in [place].       Hobbies: running (huge passion runs marathons), biking, gardening, occasional golf   Social Determinants of Health   Financial Resource Strain: Low Risk  (09/30/2022)   Overall Financial Resource Strain (CARDIA)    Difficulty of Paying Living Expenses: Not hard at all  Food Insecurity: No Food Insecurity (09/30/2022)   Hunger Vital Sign    Worried About Running Out of Food in the Last Year: Never true    Ran Out of Food in the Last Year: Never true  Transportation Needs: No Transportation Needs (09/30/2022)   PRAPARE - Hydrologist (Medical): No    Lack of Transportation (Non-Medical): No  Physical Activity: Sufficiently Active (09/30/2022)   Exercise Vital Sign    Days of Exercise per Week: 6 days    Minutes of Exercise per Session: 90 min  Stress: No Stress Concern Present (09/30/2022)   [place]    Feeling of Stress : Not at all  Social Connections: [place] (09/30/2022)   Social Connection and Isolation Panel [NHANES]    Frequency of Communication with Friends and Family: More  than three times a week    Frequency of Social Gatherings with Friends and Family: More than three times a week    Attends Religious Services: More than 4 times per year    Active Member of [employer] or Organizations: Yes    Attends Music therapist: More than 4 times per year    Marital Status: Married    Tobacco Counseling Counseling given: Not Answered Tobacco comments:  ONLY SMOKED FOR 5 Y EARS or less with limited cigarettes   Clinical Intake:  Pre-visit preparation completed: Yes  Pain : No/denies pain     BMI - recorded: 23.4 Nutritional Status: BMI of 19-24  Normal Nutritional Risks: None Diabetes: No  How often do you need to have someone help you when you read instructions, pamphlets, or other written materials from your doctor or pharmacy?: 1 - Never  Diabetic?no  Interpreter Needed?: No  Information entered by :: Charlott Rakes, LPN   Activities of Daily Living    09/30/2022    9:19 AM  In your present state of health, do you have any difficulty performing the following activities:  Hearing? 0  Vision? 0  Difficulty concentrating or making decisions? 0  Walking or climbing stairs? 0  Dressing or bathing? 0  Doing errands, shopping? 0  Preparing Food and eating ? N  Using the Toilet? N  In the past six months, have you accidently leaked urine? N  Do you have problems with loss of bowel control? N  Managing your Medications? N  Managing your Finances? N  Housekeeping or managing your Housekeeping? N    Patient Care Team: Marin Olp, MD as PCP - General (Family Medicine) Edythe Clarity, [facility] (Pharmacist)  Indicate any recent Medical Services you may have received from other than [facility] providers in the past year (date may be approximate).     Assessment:   This is a routine wellness examination for [place].  Hearing/Vision screen Hearing Screening - Comments:: Pt denies any hearing issues  Vision Screening - Comments:: Pt  follows up with Dr Delman Cheadle for annual eye exams   Dietary issues and exercise activities discussed: Current Exercise Habits: Home exercise routine, Type of exercise: Other - see comments, Time (Minutes): > 60, Frequency (Times/Week): 6, Weekly Exercise (Minutes/Week): 0   Goals Addressed             This Visit's Progress    Patient Stated       Blood pressure low        Depression Screen    09/30/2022    1:45 PM 09/09/2021    1:51 PM 06/25/2021    2:41 PM 12/13/2020    1:17 PM 02/14/2020    2:43 PM 06/10/2019   10:37 AM 09/07/2018    1:25 PM  PHQ 2/9 Scores  PHQ - 2 Score 0 0 0 0 0 0 0  PHQ- 9 Score    0  0 0    Fall Risk    09/30/2022    9:19 AM 09/09/2021    1:55 PM 06/25/2021    2:40 PM 12/13/2020    1:17 PM 02/14/2020    2:43 PM  [place] in the past year? 0 0 0 1 0  Number falls in past yr: 0 0 0 0 0  Injury with Fall? 0 0 0 1 0  Risk for fall due to : Impaired vision Impaired vision No Fall Risks    Follow up Falls prevention discussed Falls prevention discussed   Falls evaluation completed    FALL RISK PREVENTION PERTAINING TO THE HOME:  Any stairs in or around the home? Yes  If so, are there any without handrails? No  Home free of loose throw rugs in walkways, pet beds, electrical cords, etc? Yes  Adequate lighting in your home to reduce risk of falls? Yes   ASSISTIVE DEVICES UTILIZED TO PREVENT FALLS:  Life alert?  No  Use of a cane, walker or w/c? No  Grab bars in the bathroom? No  Shower chair or bench in shower? Yes  Elevated toilet seat or a handicapped toilet? No   TIMED UP AND GO:  Was the test performed? No .   Cognitive Function:        09/30/2022    1:47 PM 09/09/2021    1:56 PM 06/10/2019    3:56 PM  6CIT Screen  What Year? 0 points 0 points 0 points  What month? 0 points 0 points 0 points  What time? 0 points 0 points 0 points  Count back from 20 0 points 0 points 0 points  Months in reverse 0 points 0 points 0 points  Repeat  phrase 0 points 0 points 0 points  Total Score 0 points 0 points 0 points    Immunizations Immunization History  Administered Date(s) Administered   Fluad Quad(high Dose 65+) 06/14/2020, 05/30/2021   Hepatitis A 04/20/2009, 12/28/2014   Influenza Whole 07/04/2009   Influenza, High Dose Seasonal PF 03/30/2019, 04/29/2022   Influenza,inj,Quad PF,6+ Mos 04/20/2013, 03/19/2016, 05/25/2017   Influenza-Unspecified 05/16/2014, 05/16/2015, 06/18/2018   Moderna Sars-Covid-2 Vaccination 01/15/2021, 07/09/2021   PFIZER Comirnaty(Gray Top)Covid-19 Tri-Sucrose Vaccine 04/29/2022   PFIZER(Purple Top)SARS-COV-2 Vaccination 08/24/2019, 09/14/2019, 04/14/2020   Pneumococcal Conjugate-13 09/05/2014   Pneumococcal Polysaccharide-23 10/18/2012   Respiratory Syncytial Virus Vaccine,Recomb Aduvanted(Arexvy) 06/13/2022   Td 08/04/2005   Tdap 09/20/2015   Zoster Recombinat (Shingrix) 10/30/2021, 12/31/2021   Zoster, Live 10/18/2012    TDAP status: Up to date  Flu Vaccine status: Up to date  Pneumococcal vaccine status: Up to date  Covid-19 vaccine status: Completed vaccines  Qualifies for Shingles Vaccine? Yes   Zostavax completed Yes   Shingrix Completed?: Yes  Screening Tests Health Maintenance  Topic Date Due   COVID-19 Vaccine (7 - 2023-24 season) 06/24/2022   Medicare Annual Wellness (AWV)  10/01/2023   COLONOSCOPY (Pts 45-10yr Insurance coverage will need to be confirmed)  08/07/2024   DTaP/Tdap/Td (3 - Td or Tdap) 09/19/2025   Pneumonia Vaccine 77 Years old  Completed   INFLUENZA VACCINE  Completed   Hepatitis C Screening  Completed   Zoster Vaccines- Shingrix  Completed   HPV VACCINES  Aged Out    Health Maintenance  Health Maintenance Due  Topic Date Due   COVID-19 Vaccine (7 - 2023-24 season) 06/24/2022    Colorectal cancer screening: Type of screening: Colonoscopy. Completed 08/07/21. Repeat every 3 years  Additional Screening:  Hepatitis C Screening: Completed  09/03/15  Vision Screening: Recommended annual ophthalmology exams for early detection of glaucoma and other disorders of the eye. Is the patient up to date with their annual eye exam?  Yes  Who is the provider or what is the name of the office in which the patient attends annual eye exams? Dr GDelman Cheadle If pt is not established with a provider, would they like to be referred to a provider to establish care? No .   Dental Screening: Recommended annual dental exams for proper oral hygiene  Community Resource Referral / Chronic Care Management: CRR required this visit?  No   CCM required this visit?  No      Plan:     I have personally reviewed and noted the following in the patient's chart:   Medical and social history Use of alcohol, tobacco or illicit drugs  Current medications and supplements including opioid prescriptions. Patient is not currently taking opioid prescriptions. Functional ability and  status Nutritional status Physical activity Advanced directives List of other physicians Hospitalizations, surgeries, and ER visits in previous 12 months Vitals Screenings to include cognitive, depression, and falls Referrals and appointments  In addition, I have reviewed and discussed with patient certain preventive protocols, quality metrics, and best practice recommendations. A written personalized care plan for preventive services as well as general preventive health recommendations were provided to patient.     Willette Brace, LPN   [id number]   Nurse Notes: none

## 2022-09-30 NOTE — Patient Instructions (Signed)
Thomas Craig , Thank you for taking time to come for your Medicare Wellness Visit. I appreciate your ongoing commitment to your health goals. Please review the following plan we discussed and let me know if I can assist you in the future.   These are the goals we discussed:  Goals      Blood Pressure < 120/70     Will monitor sodium 2500 mg or 1 tsp of sodium daily  Or you can drink lemon water (hot) in the am x 3 if you have had a lot of sodium        Patient Stated     Pt stated increase running and speed      [place] (see longitudinal plan of care for additional care plan information)  Current Barriers:  Chronic Disease Management support, education, and care coordination needs related to Hypertension and Hyperlipidemia   Hypertension BP Readings from Last 3 Encounters:  02/14/20 (!) 178/82  09/15/19 138/82  06/10/19 (!) 150/82  Recent home Bps: 120-130/80s  Pharmacist Clinical Goal(s): Over the next 180 days, patient will work with PharmD and providers to maintain BP goal <130/80 Current regimen:  Amlodipine 5 mg once daily  Interventions: Continue current management Patient self care activities - Over the next 180 days, patient will: Check BP at least once every 1-2 weeks, document, and provide at future appointments Ensure daily salt intake < 2300 mg/day  Hyperlipidemia Lab Results  Component Value Date/Time   LDLCALC 118 (H) 09/15/2019 02:29 PM   LDLDIRECT 100.5 10/27/2011 10:41 AM  Pharmacist Clinical Goal(s): Over the next 180 days, patient will work with PharmD and providers to achieve LDL goal < 70 Current regimen:  No current medications Interventions: Reviewed diet/exercise recommendations and discussed risk benefit of statin use Patient self care activities - Over the next 180 days, patient will: Continue current management Further discuss statin with PCP 09/2019.  Medication management Pharmacist Clinical  Goal(s): Over the next 180 days, patient will work with PharmD and providers to maintain optimal medication adherence. Current pharmacy: [facility] Comprehensive medication review performed. Continue current medication management strategy Patient self care activities - Over the next 180 days, patient will: Take medications as prescribed Report any questions or concerns to PharmD and/or provider(s)  Initial goal documentation.     Track and Manage My Blood Pressure-Hypertension     Timeframe:  Long-Range Goal Priority:  High Start Date:   04/16/21                          Expected End Date: 10/14/21                       Follow Up Date 07/16/21    - check blood pressure weekly - choose a place to take my blood pressure (home, clinic or office, retail store) - write blood pressure results in a log or diary    Why is this important?   You won't feel high blood pressure, but it can still hurt your blood vessels.  High blood pressure can cause heart or kidney problems. It can also cause a stroke.  Making lifestyle changes like losing a little weight or eating less salt will help.  Checking your blood pressure at home and at different times of the day can help to control blood pressure.  If the doctor prescribes medicine remember to take  it the way the doctor ordered.  Call the office if you cannot afford the medicine or if there are questions about it.     Notes:         This is a list of the screening recommended for you and due dates:  Health Maintenance  Topic Date Due   COVID-19 Vaccine (7 - 2023-24 season) 06/24/2022   Medicare Annual Wellness Visit  09/09/2022   Colon Cancer Screening  08/07/2024   DTaP/Tdap/Td vaccine (3 - Td or Tdap) 09/19/2025   Pneumonia Vaccine  Completed   Flu Shot  Completed   Hepatitis C Screening: USPSTF Recommendation to screen - Ages 18-79 yo.  Completed   Zoster (Shingles) Vaccine  Completed   HPV Vaccine  Aged Out     Advanced directives: Please bring a copy of your health care power of attorney and living will to the office at your convenience.  Conditions/risks identified: keep blood pressure low   Next appointment: Follow up in one year for your annual wellness visit.   Preventive Care 51 Years and Older, Male  Preventive care refers to lifestyle choices and visits with your health care provider that can promote health and wellness. What does preventive care include? A yearly physical exam. This is also called an annual well check. Dental exams once or twice a year. Routine eye exams. Ask your health care provider how often you should have your eyes checked. Personal lifestyle choices, including: Daily care of your teeth and gums. Regular physical activity. Eating a healthy diet. Avoiding tobacco and drug use. Limiting alcohol use. Practicing safe sex. Taking low doses of aspirin every day. Taking vitamin and mineral supplements as recommended by your health care provider. What happens during an annual well check? The services and screenings done by your health care provider during your annual well check will depend on your age, overall health, lifestyle risk factors, and family history of disease. Counseling  Your health care provider may ask you questions about your: Alcohol use. Tobacco use. Drug use. Emotional well-being. Home and relationship well-being. Sexual activity. Eating habits. History of falls. Memory and ability to understand (cognition). Work and work Statistician. Screening  You may have the following tests or measurements: Height, weight, and BMI. Blood pressure. Lipid and cholesterol levels. These may be checked every 5 years, or more frequently if you are over 84 years old. Skin check. Lung cancer screening. You may have this screening every year starting at age 21 if you have a 30-pack-year history of smoking and currently smoke or have quit within the past 15  years. Fecal occult blood test (FOBT) of the stool. You may have this test every year starting at age 23. Flexible sigmoidoscopy or colonoscopy. You may have a sigmoidoscopy every 5 years or a colonoscopy every 10 years starting at age 47. Prostate cancer screening. Recommendations will vary depending on your family history and other risks. Hepatitis C blood test. Hepatitis B blood test. Sexually transmitted disease (STD) testing. Diabetes screening. This is done by checking your blood sugar (glucose) after you have not eaten for a while (fasting). You may have this done every 1-3 years. Abdominal aortic aneurysm (AAA) screening. You may need this if you are a current or former smoker. Osteoporosis. You may be screened starting at age 49 if you are at high risk. Talk with your health care provider about your test results, treatment options, and if necessary, the need for more tests. Vaccines  Your health care provider  may recommend certain vaccines, such as: Influenza vaccine. This is recommended every year. Tetanus, diphtheria, and acellular pertussis (Tdap, Td) vaccine. You may need a Td booster every 10 years. Zoster vaccine. You may need this after age 71. Pneumococcal 13-valent conjugate (PCV13) vaccine. One dose is recommended after age 68. Pneumococcal polysaccharide (PPSV23) vaccine. One dose is recommended after age 8. Talk to your health care provider about which screenings and vaccines you need and how often you need them. This information is not intended to replace advice given to you by your health care provider. Make sure you discuss any questions you have with your health care provider. Document Released: 08/17/2015 Document Revised: 04/09/2016 Document Reviewed: 05/22/2015 Elsevier Interactive Patient Education  2017 [place] Prevention in the Home Falls can cause injuries. They can happen to people of all ages. There are many things you can do to make your home  safe and to help prevent falls. What can I do on the outside of my home? Regularly fix the edges of walkways and driveways and fix any cracks. Remove anything that might make you trip as you walk through a door, such as a raised step or threshold. Trim any bushes or trees on the path to your home. Use bright outdoor lighting. Clear any walking paths of anything that might make someone trip, such as rocks or tools. Regularly check to see if handrails are loose or broken. Make sure that both sides of any steps have handrails. Any raised decks and porches should have guardrails on the edges. Have any leaves, snow, or ice cleared regularly. Use sand or salt on walking paths during winter. Clean up any spills in your garage right away. This includes oil or grease spills. What can I do in the bathroom? Use night lights. Install grab bars by the toilet and in the tub and shower. Do not use towel bars as grab bars. Use non-skid mats or decals in the tub or shower. If you need to sit down in the shower, use a plastic, non-slip stool. Keep the floor dry. Clean up any water that spills on the floor as soon as it happens. Remove soap buildup in the tub or shower regularly. Attach bath mats securely with double-sided non-slip rug tape. Do not have throw rugs and other things on the floor that can make you trip. What can I do in the bedroom? Use night lights. Make sure that you have a light by your bed that is easy to reach. Do not use any sheets or blankets that are too big for your bed. They should not hang down onto the floor. Have a firm chair that has side arms. You can use this for support while you get dressed. Do not have throw rugs and other things on the floor that can make you trip. What can I do in the kitchen? Clean up any spills right away. Avoid walking on wet floors. Keep items that you use a lot in easy-to-reach places. If you need to reach something above you, use a strong step  stool that has a grab bar. Keep electrical cords out of the way. Do not use floor polish or wax that makes floors slippery. If you must use wax, use non-skid floor wax. Do not have throw rugs and other things on the floor that can make you trip. What can I do with my stairs? Do not leave any items on the stairs. Make sure that there are handrails on both sides  of the stairs and use them. Fix handrails that are broken or loose. Make sure that handrails are as long as the stairways. Check any carpeting to make sure that it is firmly attached to the stairs. Fix any carpet that is loose or worn. Avoid having throw rugs at the top or bottom of the stairs. If you do have throw rugs, attach them to the floor with carpet tape. Make sure that you have a light switch at the top of the stairs and the bottom of the stairs. If you do not have them, ask someone to add them for you. What else can I do to help prevent falls? Wear shoes that: Do not have high heels. Have rubber bottoms. Are comfortable and fit you well. Are closed at the toe. Do not wear sandals. If you use a stepladder: Make sure that it is fully opened. Do not climb a closed stepladder. Make sure that both sides of the stepladder are locked into place. Ask someone to hold it for you, if possible. Clearly mark and make sure that you can see: Any grab bars or handrails. First and last steps. Where the edge of each step is. Use tools that help you move around (mobility aids) if they are needed. These include: Canes. Walkers. Scooters. Crutches. Turn on the lights when you go into a dark area. Replace any light bulbs as soon as they burn out. Set up your furniture so you have a clear path. Avoid moving your furniture around. If any of your floors are uneven, fix them. If there are any pets around you, be aware of where they are. Review your medicines with your doctor. Some medicines can make you feel dizzy. This can increase your  chance of falling. Ask your doctor what other things that you can do to help prevent falls. This information is not intended to replace advice given to you by your health care provider. Make sure you discuss any questions you have with your health care provider. Document Released: 05/17/2009 Document Revised: 12/27/2015 Document Reviewed: 08/25/2014 Elsevier Interactive Patient Education  2017 [employer].

## 2022-10-03 ENCOUNTER — Encounter: Payer: Self-pay | Admitting: Family Medicine

## 2022-10-03 ENCOUNTER — Ambulatory Visit (INDEPENDENT_AMBULATORY_CARE_PROVIDER_SITE_OTHER): Payer: Medicare Other | Admitting: Family Medicine

## 2022-10-03 VITALS — BP 125/66 | HR 74 | Temp 97.7°F | Ht 66.0 in | Wt 148.2 lb

## 2022-10-03 DIAGNOSIS — R972 Elevated prostate specific antigen [PSA]: Secondary | ICD-10-CM | POA: Diagnosis not present

## 2022-10-03 DIAGNOSIS — R3121 Asymptomatic microscopic hematuria: Secondary | ICD-10-CM

## 2022-10-03 DIAGNOSIS — N401 Enlarged prostate with lower urinary tract symptoms: Secondary | ICD-10-CM | POA: Diagnosis not present

## 2022-10-03 DIAGNOSIS — E785 Hyperlipidemia, unspecified: Secondary | ICD-10-CM

## 2022-10-03 DIAGNOSIS — I77811 Abdominal aortic ectasia: Secondary | ICD-10-CM | POA: Diagnosis not present

## 2022-10-03 DIAGNOSIS — R351 Nocturia: Secondary | ICD-10-CM | POA: Diagnosis not present

## 2022-10-03 DIAGNOSIS — I10 Essential (primary) hypertension: Secondary | ICD-10-CM

## 2022-10-03 NOTE — Progress Notes (Signed)
Phone [phone] In person visit   Subjective:   Thomas Craig is a 77 y.o. year old very pleasant male patient who presents for/with See problem oriented charting Chief Complaint  Patient presents with   Follow-up    (No mask)   Hyperlipidemia   Hypertension   Past Medical History-  Patient Active Problem List   Diagnosis Date Noted   History of melanoma 09/05/2014    Priority: High   Ectatic abdominal aorta ([place]) 12/24/2021    Priority: Medium    History of adenomatous polyp of colon 02/23/2018    Priority: Medium    BPH (benign prostatic hyperplasia) 09/05/2014    Priority: Medium    Essential hypertension 09/05/2014    Priority: Medium    Hyperlipidemia 09/05/2014    Priority: Medium    History of hematuria 06/19/2016    Priority: Low   Bunion of great toe of left foot 10/20/2011    Priority: Low    Medications- reviewed and updated Current Outpatient Medications  Medication Sig Dispense Refill   amLODipine (NORVASC) 5 MG tablet Take 1 tablet (5 mg total) by mouth daily. 90 tablet 3   Ascorbic Acid (VITAMIN C) 1000 MG tablet Take 3,000 mg by mouth daily.      B Complex-C-Folic Acid (STRESS B COMPLEX PO) Take by mouth.     cyanocobalamin 500 MCG tablet Take 500 mcg by mouth daily.     doxylamine, Sleep, (UNISOM) 25 MG tablet Take 25 mg by mouth at bedtime as needed.     Glucosamine-Chondroit-Vit C-Mn (GLUCOSAMINE CHONDR 1500 COMPLX) CAPS Take 3-4 capsules by mouth daily.      Multiple Vitamin (MULTIVITAMIN) tablet Take 1 tablet by mouth daily. Contains iron     multivitamin-lutein (OCUVITE-LUTEIN) CAPS Take 1 capsule by mouth daily.     Omega-3 Fatty Acids (OMEGA-3 PO) Take 4 capsules by mouth daily.     saw palmetto 160 MG capsule Take 160 mg by mouth 2 (two) times daily. [place] prostrate for men     No current facility-administered medications for this visit.     Objective:  BP 125/66 Comment: most recent home reading  Pulse 74   Temp 97.7 F (36.5 C)    Ht '5\' 6"'$  (1.676 m)   Wt 148 lb 3.2 oz (67.2 kg)   SpO2 98%   BMI 23.92 kg/m  Gen: NAD, resting comfortably CV: RRR no murmurs rubs or gallops Lungs: CTAB no crackles, wheeze, rhonchi Ext: no edema Skin: warm, dry     Assessment and Plan   #hypertension S: medication: Amlodipine 5 mg Home readings #s: 125/66 yesterday, 130/68 average over last 100- mild increase from last summer -Patient continues to run 25 miles a week- including today  -in office repeat 128/86- higher than his usual BP Readings from Last 3 Encounters:  10/03/22 125/66  02/19/22 128/72  12/24/21 (!) 119/54  A/P: controlled on home readings- continue current medications    #hyperlipidemia S: Medication: (has stopped Aspirin 81 mg), omega-3- has wanted to stay off statin - Mild elevations on coronary calcium score of 32-which was 23rd percentile for age. Lab Results  Component Value Date   CHOL 251 (H) 12/24/2021   HDL 107.00 12/24/2021   LDLCALC 132 (H) 12/24/2021   LDLDIRECT 100.5 10/27/2011   TRIG 59.0 12/24/2021   CHOLHDL 2 12/24/2021   A/P: lipids mildly elevated but wants to hold off ons tatin and continue to work on diet/exercise- will recheck next visit. We did discuss that  could also have soft plaque in addition to hardened plaque seen on coronary ct - discussed moving towards whole foods plant based diet is healthy  #Prostate cancer screening and microscopic hematuria had preferred to continue to monitor with BPH with nocturia about twice a night. He did change is saw palmetto formula back to a idfferent one so he is hoping that helps -01/23/22 PSA has trended down but is still up above baseline around 1.7-1.9 in last few years-also with microscopic hematuria history- we referred last year but he never heard form them- he reached out to my team but my team did not send back contact information - we opted to recheck both PSA and look for blood in urine and decide at this time wehther to refer  back Lab Results  Component Value Date   PSA 2.89 01/22/2022   PSA 4.17 (H) 12/24/2021   PSA 1.81 02/14/2021   Component     Latest Ref Rng 01/07/2018 09/15/2018 09/15/2019 12/13/2020 02/14/2021  PSA     0.10 - 4.00 ng/mL 1.54  1.73  1.90  2.68  1.81    #Abdominal aortic ectasia-noted on AAA screening on 09/12/2020 with 2-year repeat planned    Recommended follow up: Return in about 6 months (around 04/05/2023) for followup or sooner if needed.Schedule b4 you leave. Future Appointments  Date Time Provider [place]  10/15/2023  1:30 PM [facility]    Lab/Order associations:   ICD-10-CM   1. Asymptomatic microscopic hematuria  R31.21 Urine Microscopic    CANCELED: Urine Microscopic    2. Ectatic abdominal aorta (HCC) Chronic I77.811 VAS [place] AAA DUPLEX    3. Elevated PSA  R97.20 PSA    CANCELED: PSA    4. Benign prostatic hyperplasia with nocturia  N40.1    R35.1     5. Essential hypertension  I10 CBC with Differential/Platelet    Comprehensive metabolic panel    CANCELED: CBC with Differential/Platelet    CANCELED: Comprehensive metabolic panel    6. Hyperlipidemia, unspecified hyperlipidemia type  E78.5 CBC with Differential/Platelet    Comprehensive metabolic panel    CANCELED: CBC with Differential/Platelet    CANCELED: Comprehensive metabolic panel      No orders of the defined types were placed in this encounter.   Return precautions advised.  Garret Reddish, MD

## 2022-10-03 NOTE — Patient Instructions (Addendum)
Schedule a lab visit at the check out desk within 2 weeks. Return for future fasting labs meaning nothing but water after midnight please. Ok to take your medications with water.  - no exercise within 24-48 hours of test  We will either call you (or see alternate below) within two weeks about your referral for aorta follow up  . Our referral specialist will sometimes also send you a mychart link once referral is approved and then you will call the # listed on there (let us know if you do not see this within 2 weeks or have not received call)  Recommended follow up: Return in about 6 months (around 04/05/2023) for followup or sooner if needed.Schedule b4 you leave.

## 2022-10-08 ENCOUNTER — Other Ambulatory Visit (INDEPENDENT_AMBULATORY_CARE_PROVIDER_SITE_OTHER): Payer: Medicare Other

## 2022-10-08 DIAGNOSIS — R3121 Asymptomatic microscopic hematuria: Secondary | ICD-10-CM | POA: Diagnosis not present

## 2022-10-08 DIAGNOSIS — I10 Essential (primary) hypertension: Secondary | ICD-10-CM

## 2022-10-08 DIAGNOSIS — R972 Elevated prostate specific antigen [PSA]: Secondary | ICD-10-CM | POA: Diagnosis not present

## 2022-10-08 DIAGNOSIS — E785 Hyperlipidemia, unspecified: Secondary | ICD-10-CM | POA: Diagnosis not present

## 2022-10-08 LAB — CBC WITH DIFFERENTIAL/PLATELET
Basophils Absolute: 0 10*3/uL (ref 0.0–0.1)
Basophils Relative: 1.1 % (ref 0.0–3.0)
Eosinophils Absolute: 0.1 10*3/uL (ref 0.0–0.7)
Eosinophils Relative: 1.8 % (ref 0.0–5.0)
HCT: 39.4 % (ref 39.0–52.0)
Hemoglobin: 13.8 g/dL (ref 13.0–17.0)
Lymphocytes Relative: 41.4 % (ref 12.0–46.0)
Lymphs Abs: 1.5 10*3/uL (ref 0.7–4.0)
MCHC: 34.9 g/dL (ref 30.0–36.0)
MCV: 94.1 fl (ref 78.0–100.0)
Monocytes Absolute: 0.4 10*3/uL (ref 0.1–1.0)
Monocytes Relative: 10.6 % (ref 3.0–12.0)
Neutro Abs: 1.7 10*3/uL (ref 1.4–7.7)
Neutrophils Relative %: 45.1 % (ref 43.0–77.0)
Platelets: 190 10*3/uL (ref 150.0–400.0)
RBC: 4.18 Mil/uL — ABNORMAL LOW (ref 4.22–5.81)
RDW: 13.7 % (ref 11.5–15.5)
WBC: 3.7 10*3/uL — ABNORMAL LOW (ref 4.0–10.5)

## 2022-10-08 LAB — COMPREHENSIVE METABOLIC PANEL
ALT: 18 U/L (ref 0–53)
AST: 22 U/L (ref 0–37)
Albumin: 4 g/dL (ref 3.5–5.2)
Alkaline Phosphatase: 29 U/L — ABNORMAL LOW (ref 39–117)
BUN: 20 mg/dL (ref 6–23)
CO2: 28 mEq/L (ref 19–32)
Calcium: 9.7 mg/dL (ref 8.4–10.5)
Chloride: 97 mEq/L (ref 96–112)
Creatinine, Ser: 0.97 mg/dL (ref 0.40–1.50)
GFR: 76 mL/min (ref >60.00)
Glucose, Bld: 90 mg/dL (ref 70–99)
Potassium: 4.4 mEq/L (ref 3.5–5.1)
Sodium: 134 mEq/L — ABNORMAL LOW (ref 135–145)
Total Bilirubin: 0.9 mg/dL (ref 0.2–1.2)
Total Protein: 6.6 g/dL (ref 6.0–8.3)

## 2022-10-08 LAB — LIPID PANEL
Cholesterol: 232 mg/dL — ABNORMAL HIGH (ref 0–200)
HDL: 86.7 mg/dL (ref >39.00)
LDL Cholesterol: 132 mg/dL — ABNORMAL HIGH (ref 0–99)
NonHDL: 145.78
Total CHOL/HDL Ratio: 3
Triglycerides: 67 mg/dL (ref 0.0–149.0)
VLDL: 13.4 mg/dL (ref 0.0–40.0)

## 2022-10-08 LAB — URINALYSIS, MICROSCOPIC ONLY

## 2022-10-08 LAB — PSA: PSA: 2.63 ng/mL (ref 0.10–4.00)

## 2022-10-09 ENCOUNTER — Other Ambulatory Visit: Payer: Self-pay

## 2022-10-09 DIAGNOSIS — R3129 Other microscopic hematuria: Secondary | ICD-10-CM

## 2022-10-28 ENCOUNTER — Ambulatory Visit (HOSPITAL_COMMUNITY)
Admission: RE | Admit: 2022-10-28 | Discharge: 2022-10-28 | Disposition: A | Discharge status: Home / Self Care | Payer: Medicare Other | Source: Ambulatory Visit | Attending: Family Medicine | Admitting: Family Medicine

## 2022-10-28 DIAGNOSIS — I77811 Abdominal aortic ectasia: Secondary | ICD-10-CM | POA: Insufficient documentation

## 2022-12-24 ENCOUNTER — Telehealth: Payer: Self-pay

## 2022-12-24 NOTE — Telephone Encounter (Signed)
Please tell him due to blood in urine id really like for him to be seen by urology- can send letter if does not respond. Want to rule out bladder cancer or other issue with bleeding in genitourinary system

## 2022-12-24 NOTE — Telephone Encounter (Signed)
Called and lm on pt vm tcb, will send letter if have not heard back by Friday.

## 2022-12-24 NOTE — Telephone Encounter (Signed)
FYI, I received a fax from [facility] stating that patient has not responded to multiple attempts of them trying to get him scheduled.

## 2022-12-26 NOTE — Telephone Encounter (Signed)
I called and was able to reach pt and provided number to [facility] and pt states he will try and give them a call.

## 2023-03-17 DIAGNOSIS — R3121 Asymptomatic microscopic hematuria: Secondary | ICD-10-CM | POA: Diagnosis not present

## 2023-03-17 DIAGNOSIS — Z125 Encounter for screening for malignant neoplasm of prostate: Secondary | ICD-10-CM | POA: Diagnosis not present

## 2023-03-19 ENCOUNTER — Encounter (INDEPENDENT_AMBULATORY_CARE_PROVIDER_SITE_OTHER): Payer: Self-pay

## 2023-04-09 ENCOUNTER — Encounter: Payer: Self-pay | Admitting: Family Medicine

## 2023-04-09 ENCOUNTER — Ambulatory Visit (INDEPENDENT_AMBULATORY_CARE_PROVIDER_SITE_OTHER): Payer: Medicare Other | Admitting: Family Medicine

## 2023-04-09 VITALS — BP 125/66 | HR 68 | Temp 97.0°F | Ht 66.0 in | Wt 148.4 lb

## 2023-04-09 DIAGNOSIS — E785 Hyperlipidemia, unspecified: Secondary | ICD-10-CM

## 2023-04-09 DIAGNOSIS — I10 Essential (primary) hypertension: Secondary | ICD-10-CM | POA: Diagnosis not present

## 2023-04-09 DIAGNOSIS — Z23 Encounter for immunization: Secondary | ICD-10-CM

## 2023-04-09 NOTE — Patient Instructions (Addendum)
Let us know if you get  a COVID vaccine this fall.  Thanks for doing flu shot  [place] job getting back on the running  Recommended follow up: Return in about 6 months (around 10/07/2023) for followup or sooner if needed.Schedule b4 you leave.

## 2023-04-09 NOTE — Progress Notes (Signed)
Phone [phone] In person visit   Subjective:   Thomas Craig is a 77 y.o. year old very pleasant male patient who presents for/with See problem oriented charting Chief Complaint  Patient presents with   Medical Management of Chronic Issues   Hypertension   Hyperlipidemia   Past Medical History-  Patient Active Problem List   Diagnosis Date Noted   History of melanoma 09/05/2014    Priority: High   Ectatic abdominal aorta ([facility]) 12/24/2021    Priority: Medium    History of adenomatous polyp of colon 02/23/2018    Priority: Medium    BPH (benign prostatic hyperplasia) 09/05/2014    Priority: Medium    Essential hypertension 09/05/2014    Priority: Medium    Hyperlipidemia 09/05/2014    Priority: Medium    History of hematuria 06/19/2016    Priority: Low   Bunion of great toe of left foot 10/20/2011    Priority: Low    Medications- reviewed and updated Current Outpatient Medications  Medication Sig Dispense Refill   amLODipine (NORVASC) 5 MG tablet Take 1 tablet (5 mg total) by mouth daily. 90 tablet 3   Ascorbic Acid (VITAMIN C) 1000 MG tablet Take 3,000 mg by mouth daily.      B Complex-C-Folic Acid (STRESS B COMPLEX PO) Take by mouth.     cyanocobalamin 500 MCG tablet Take 500 mcg by mouth daily.     Glucosamine-Chondroit-Vit C-Mn (GLUCOSAMINE CHONDR 1500 COMPLX) CAPS Take 3-4 capsules by mouth daily.      Multiple Vitamin (MULTIVITAMIN) tablet Take 1 tablet by mouth daily. Contains iron     multivitamin-lutein (OCUVITE-LUTEIN) CAPS Take 1 capsule by mouth daily.     Omega-3 Fatty Acids (OMEGA-3 PO) Take 4 capsules by mouth daily.     saw palmetto 160 MG capsule Take 160 mg by mouth 2 (two) times daily. [employer] prostrate for men     doxylamine, Sleep, (UNISOM) 25 MG tablet Take 25 mg by mouth at bedtime as needed. (Patient not taking: Reported on 04/09/2023)     No current facility-administered medications for this visit.     Objective:  BP 125/66 Comment: most  recent home reading  Pulse 68   Temp (!) 97 F (36.1 C)   Ht 5\' 6"  (1.676 m)   Wt 148 lb 6.4 oz (67.3 kg)   SpO2 98%   BMI 23.95 kg/m  Gen: NAD, resting comfortably CV: RRR no murmurs rubs or gallops Lungs: CTAB no crackles, wheeze, rhonchi Ext: no edema Skin: warm, dry    Assessment and Plan   #health maintenance - flu shot today. Undecided on COVID at this time.   #hypertension S: medication: Amlodipine 5 mg BP Readings from Last 3 Encounters:  04/09/23 125/66  10/03/22 125/66  02/19/22 128/72  Home readings #s: 120s or 130s for most part over 60s - running had been down due to injury but was able to pick back up in last 6 weeks- closer to his typical range A/P: stable- continue current medicines    #hyperlipidemia S: Medication: omega-3- wants to stay off statin - Mild elevations on coronary calcium score of 32-which was 23rd percentile for age in 2021 Lab Results  Component Value Date   CHOL 232 (H) 10/08/2022   HDL 86.70 10/08/2022   LDLCALC 132 (H) 10/08/2022   LDLDIRECT 100.5 10/27/2011   TRIG 67.0 10/08/2022   CHOLHDL 3 10/08/2022   A/P: cholesterol reasonably well controlled in light of not substantially elevated calcium score-  we could be more aggressive but patient prefers to remain off statin-discussed repeat CT calcium in perhaps 2026  # Microscopic hematuria-patient saw urology.  He agreed to renal ultrasound but refused cystoscopy.  Visit was on 03/17/2023 and he is still pending the ultrasound in a few weeks - urine looked better on their repeat apparently  -He is aware of possible bladder cancer risk- we also wondered if his running history could play a role. Does have history of limited smoking under 4 pack years in the 1970s.   #leukopenia- very mild- offered repeat- wants to do in 6 months with full labs  Recommended follow up: Return in about 6 months (around 10/07/2023) for followup or sooner if needed.Schedule b4 you leave. Future Appointments   Date Time Provider Department Center  10/15/2023  1:30 PM [facility] ANNUAL WELLNESS VISIT 1 [facility]    Lab/Order associations:   ICD-10-CM   1. Essential hypertension  I10     2. Hyperlipidemia, unspecified hyperlipidemia type  E78.5     3. Need for influenza vaccination  Z23 Flu Vaccine Trivalent High Dose (Fluad)    CANCELED: Flu vaccine HIGH DOSE PF (Fluzone High dose)      No orders of the defined types were placed in this encounter.   Return precautions advised.  Tana Conch, MD

## 2023-04-14 DIAGNOSIS — L814 Other melanin hyperpigmentation: Secondary | ICD-10-CM | POA: Diagnosis not present

## 2023-04-14 DIAGNOSIS — B078 Other viral warts: Secondary | ICD-10-CM | POA: Diagnosis not present

## 2023-04-14 DIAGNOSIS — Z85828 Personal history of other malignant neoplasm of skin: Secondary | ICD-10-CM | POA: Diagnosis not present

## 2023-04-14 DIAGNOSIS — Z85820 Personal history of malignant melanoma of skin: Secondary | ICD-10-CM | POA: Diagnosis not present

## 2023-04-14 DIAGNOSIS — Z08 Encounter for follow-up examination after completed treatment for malignant neoplasm: Secondary | ICD-10-CM | POA: Diagnosis not present

## 2023-04-14 DIAGNOSIS — L570 Actinic keratosis: Secondary | ICD-10-CM | POA: Diagnosis not present

## 2023-04-14 DIAGNOSIS — D225 Melanocytic nevi of trunk: Secondary | ICD-10-CM | POA: Diagnosis not present

## 2023-04-14 DIAGNOSIS — L821 Other seborrheic keratosis: Secondary | ICD-10-CM | POA: Diagnosis not present

## 2023-04-14 DIAGNOSIS — Z86006 Personal history of melanoma in-situ: Secondary | ICD-10-CM | POA: Diagnosis not present

## 2023-04-28 DIAGNOSIS — B079 Viral wart, unspecified: Secondary | ICD-10-CM | POA: Diagnosis not present

## 2023-04-28 DIAGNOSIS — D485 Neoplasm of uncertain behavior of skin: Secondary | ICD-10-CM | POA: Diagnosis not present

## 2023-04-28 DIAGNOSIS — D492 Neoplasm of unspecified behavior of bone, soft tissue, and skin: Secondary | ICD-10-CM | POA: Diagnosis not present

## 2023-05-11 DIAGNOSIS — R3121 Asymptomatic microscopic hematuria: Secondary | ICD-10-CM | POA: Diagnosis not present

## 2023-05-11 DIAGNOSIS — N130 Hydronephrosis with ureteropelvic junction obstruction: Secondary | ICD-10-CM | POA: Diagnosis not present

## 2023-06-02 DIAGNOSIS — H2513 Age-related nuclear cataract, bilateral: Secondary | ICD-10-CM | POA: Diagnosis not present

## 2023-06-02 DIAGNOSIS — H5213 Myopia, bilateral: Secondary | ICD-10-CM | POA: Diagnosis not present

## 2023-06-02 DIAGNOSIS — H31003 Unspecified chorioretinal scars, bilateral: Secondary | ICD-10-CM | POA: Diagnosis not present

## 2023-06-09 DIAGNOSIS — Z23 Encounter for immunization: Secondary | ICD-10-CM | POA: Diagnosis not present

## 2023-06-10 ENCOUNTER — Encounter: Payer: Self-pay | Admitting: Family Medicine

## 2023-06-26 DIAGNOSIS — R3121 Asymptomatic microscopic hematuria: Secondary | ICD-10-CM | POA: Diagnosis not present

## 2023-06-26 DIAGNOSIS — N132 Hydronephrosis with renal and ureteral calculous obstruction: Secondary | ICD-10-CM | POA: Diagnosis not present

## 2023-06-26 DIAGNOSIS — K4020 Bilateral inguinal hernia, without obstruction or gangrene, not specified as recurrent: Secondary | ICD-10-CM | POA: Diagnosis not present

## 2023-06-26 DIAGNOSIS — R3129 Other microscopic hematuria: Secondary | ICD-10-CM | POA: Diagnosis not present

## 2023-06-26 DIAGNOSIS — N134 Hydroureter: Secondary | ICD-10-CM | POA: Diagnosis not present

## 2023-07-14 DIAGNOSIS — R3121 Asymptomatic microscopic hematuria: Secondary | ICD-10-CM | POA: Diagnosis not present

## 2023-07-14 DIAGNOSIS — N130 Hydronephrosis with ureteropelvic junction obstruction: Secondary | ICD-10-CM | POA: Diagnosis not present

## 2023-07-14 DIAGNOSIS — N202 Calculus of kidney with calculus of ureter: Secondary | ICD-10-CM | POA: Diagnosis not present

## 2023-08-28 ENCOUNTER — Other Ambulatory Visit: Payer: Self-pay | Admitting: Family Medicine

## 2023-09-25 DIAGNOSIS — N202 Calculus of kidney with calculus of ureter: Secondary | ICD-10-CM | POA: Diagnosis not present

## 2023-10-07 ENCOUNTER — Encounter: Payer: Self-pay | Admitting: Family Medicine

## 2023-10-07 ENCOUNTER — Ambulatory Visit: Payer: Medicare Other | Admitting: Family Medicine

## 2023-10-07 VITALS — BP 122/74 | HR 65 | Temp 97.3°F | Ht 66.0 in | Wt 146.4 lb

## 2023-10-07 DIAGNOSIS — I10 Essential (primary) hypertension: Secondary | ICD-10-CM

## 2023-10-07 DIAGNOSIS — E785 Hyperlipidemia, unspecified: Secondary | ICD-10-CM

## 2023-10-07 DIAGNOSIS — I77811 Abdominal aortic ectasia: Secondary | ICD-10-CM

## 2023-10-07 LAB — CBC WITH DIFFERENTIAL/PLATELET
Basophils Absolute: 0 10*3/uL (ref 0.0–0.1)
Basophils Relative: 0.8 % (ref 0.0–3.0)
Eosinophils Absolute: 0 10*3/uL (ref 0.0–0.7)
Eosinophils Relative: 1.4 % (ref 0.0–5.0)
HCT: 41.7 % (ref 39.0–52.0)
Hemoglobin: 14.3 g/dL (ref 13.0–17.0)
Lymphocytes Relative: 39.6 % (ref 12.0–46.0)
Lymphs Abs: 1.4 10*3/uL (ref 0.7–4.0)
MCHC: 34.3 g/dL (ref 30.0–36.0)
MCV: 95.7 fl (ref 78.0–100.0)
Monocytes Absolute: 0.4 10*3/uL (ref 0.1–1.0)
Monocytes Relative: 9.9 % (ref 3.0–12.0)
Neutro Abs: 1.7 10*3/uL (ref 1.4–7.7)
Neutrophils Relative %: 48.3 % (ref 43.0–77.0)
Platelets: 208 10*3/uL (ref 150.0–400.0)
RBC: 4.36 Mil/uL (ref 4.22–5.81)
RDW: 13.4 % (ref 11.5–15.5)
WBC: 3.6 10*3/uL — ABNORMAL LOW (ref 4.0–10.5)

## 2023-10-07 LAB — COMPREHENSIVE METABOLIC PANEL
ALT: 24 U/L (ref 0–53)
AST: 28 U/L (ref 0–37)
Albumin: 4.6 g/dL (ref 3.5–5.2)
Alkaline Phosphatase: 32 U/L — ABNORMAL LOW (ref 39–117)
BUN: 19 mg/dL (ref 6–23)
CO2: 31 meq/L (ref 19–32)
Calcium: 9.6 mg/dL (ref 8.4–10.5)
Chloride: 96 meq/L (ref 96–112)
Creatinine, Ser: 0.92 mg/dL (ref 0.40–1.50)
GFR: 80.42 mL/min (ref >60.00)
Glucose, Bld: 98 mg/dL (ref 70–99)
Potassium: 3.9 meq/L (ref 3.5–5.1)
Sodium: 133 meq/L — ABNORMAL LOW (ref 135–145)
Total Bilirubin: 0.9 mg/dL (ref 0.2–1.2)
Total Protein: 7.4 g/dL (ref 6.0–8.3)

## 2023-10-07 LAB — LIPID PANEL
Cholesterol: 243 mg/dL — ABNORMAL HIGH (ref 0–200)
HDL: 99.8 mg/dL (ref >39.00)
LDL Cholesterol: 133 mg/dL — ABNORMAL HIGH (ref 0–99)
NonHDL: 143.08
Total CHOL/HDL Ratio: 2
Triglycerides: 50 mg/dL (ref 0.0–149.0)
VLDL: 10 mg/dL (ref 0.0–40.0)

## 2023-10-07 NOTE — Patient Instructions (Addendum)
  Please stop by lab before you go If you have mychart- we will send your results within 3 business days of [place] receiving them.  If you do not have mychart- we will call you about results within 5 business days of [place] receiving them.  *please also note that you will see labs on mychart as soon as they post. I will later go in and write notes on them- will say "notes from Dr. Durene Cal"   Recommended follow up: Return in about 6 months (around 04/08/2024) for followup or sooner if needed.Schedule b4 you leave.

## 2023-10-07 NOTE — Progress Notes (Signed)
 Phone [phone] In person visit   Subjective:   Thomas Craig is a 78 y.o. year old very pleasant male patient who presents for/with See problem oriented charting Chief Complaint  Patient presents with   Medical Management of Chronic Issues   Hypertension   Hyperlipidemia    Past Medical History-  Patient Active Problem List   Diagnosis Date Noted   History of melanoma 09/05/2014    Priority: High   Ectatic abdominal aorta (HCC) 12/24/2021    Priority: Medium    History of adenomatous polyp of colon 02/23/2018    Priority: Medium    BPH (benign prostatic hyperplasia) 09/05/2014    Priority: Medium    Essential hypertension 09/05/2014    Priority: Medium    Hyperlipidemia 09/05/2014    Priority: Medium    History of hematuria 06/19/2016    Priority: Low   Bunion of great toe of left foot 10/20/2011    Priority: Low    Medications- reviewed and updated Current Outpatient Medications  Medication Sig Dispense Refill   amLODipine (NORVASC) 5 MG tablet TAKE ONE TABLET BY MOUTH DAILY 90 tablet 3   Ascorbic Acid (VITAMIN C) 1000 MG tablet Take 3,000 mg by mouth daily.      B Complex-C-Folic Acid (STRESS B COMPLEX PO) Take by mouth.     cyanocobalamin 500 MCG tablet Take 500 mcg by mouth daily.     doxylamine, Sleep, (UNISOM) 25 MG tablet Take 25 mg by mouth at bedtime as needed.     Glucosamine-Chondroit-Vit C-Mn (GLUCOSAMINE CHONDR 1500 COMPLX) CAPS Take 3-4 capsules by mouth daily.      Multiple Vitamin (MULTIVITAMIN) tablet Take 1 tablet by mouth daily. Contains iron     multivitamin-lutein (OCUVITE-LUTEIN) CAPS Take 1 capsule by mouth daily.     Omega-3 Fatty Acids (OMEGA-3 PO) Take 4 capsules by mouth daily.     saw palmetto 160 MG capsule Take 160 mg by mouth 2 (two) times daily. [employer] prostrate for men     No current facility-administered medications for this visit.     Objective:  BP 122/74   Pulse 65   Temp (!) 97.3 F (36.3 C)   Ht 5\' 6"  (1.676 m)    Wt 146 lb 6.4 oz (66.4 kg)   SpO2 97%   BMI 23.63 kg/m  Gen: NAD, resting comfortably CV: RRR no murmurs rubs or gallops Lungs: CTAB no crackles, wheeze, rhonchi Ext: no edema Skin: warm, dry     Assessment and Plan   #hypertension S: medication: Amlodipine 5 mg BP Readings from Last 3 Encounters:  10/07/23 122/74  04/09/23 125/66  10/03/22 125/66  Home readings #s: similar readings at home- home diastolic in 60's , with 120s systolic -Patient continues to run regularly  A/P: blood pressure well controlled continue current medications    #hyperlipidemia S: Medication:omega-3- wants to stay off statin. Tries to eat clean and less red meat in general - Mild elevations on coronary calcium score of 32-which was 23rd percentile for age on 10/12/2019 Lab Results  Component Value Date   CHOL 232 (H) 10/08/2022   HDL 86.70 10/08/2022   LDLCALC 132 (H) 10/08/2022   LDLDIRECT 100.5 10/27/2011   TRIG 67.0 10/08/2022   CHOLHDL 3 10/08/2022   A/P: cholesterol above ideal goal for coronary artery calcium  but thankfully lower iend of risk- we discussed rechecking/reconsidering statin next year vs vegan diet.   #History of melanoma- follows with dermatology annually   #History of adenomatous polyp  of the colon-08/07/21 with 3-year repeat planned- due next year   #Prostate cancer screening-urology has recommended discontinuing over age 71 and favorable prior values as of Dr. Berneice Heinrich note 07/14/2023. He wants to hold off at least this year and reconsider next year Lab Results  Component Value Date   PSA 2.63 10/08/2022   PSA 2.89 01/22/2022   PSA 4.17 (H) 12/24/2021   # History of kidney stones and left-sided hydronephrosis as well as microscopic hematuria a-plan is for left ureteroscopy as [facility]-due to chronic partial obstruction concern- he has thought this over more though and looked at potential side effects of procedure and for now is opting out-  will check renal function- that might change his decision if worsening  #Abdominal aortic ectasia-noted on AAA screening on 11/03/2022 and 09/12/2020 with 2-3 year repeat planned- due 2026 or 2027  Recommended follow up: Return in about 6 months (around 04/08/2024) for followup or sooner if needed.Schedule b4 you leave. Future Appointments  Date Time Provider Department Center  10/15/2023  1:40 PM [facility] ANNUAL WELLNESS VISIT 1 [facility]   Lab/Order associations:   ICD-10-CM   1. Essential hypertension  I10 Comprehensive metabolic panel    CBC with Differential/Platelet    Lipid panel    2. Hyperlipidemia, unspecified hyperlipidemia type  E78.5 Comprehensive metabolic panel    CBC with Differential/Platelet    Lipid panel    3. Ectatic abdominal aorta (HCC)  I77.811       No orders of the defined types were placed in this encounter.   Return precautions advised.  Tana Conch, MD

## 2023-10-08 ENCOUNTER — Encounter: Payer: Self-pay | Admitting: Family Medicine

## 2023-10-15 ENCOUNTER — Ambulatory Visit: Payer: Medicare Other

## 2023-10-15 VITALS — Ht 67.0 in | Wt 146.0 lb

## 2023-10-15 DIAGNOSIS — Z0000 Encounter for general adult medical examination without abnormal findings: Secondary | ICD-10-CM | POA: Diagnosis not present

## 2023-10-15 NOTE — Progress Notes (Signed)
 Subjective:   Thomas Craig is a 78 y.o. who presents for a Medicare Wellness preventive visit.  Visit Complete: Virtual I connected with  Thomas Craig on 10/15/23 by a audio enabled telemedicine application and verified that I am speaking with the correct person using two identifiers.  Patient Location: Home  Provider Location: Office/Clinic  I discussed the limitations of evaluation and management by telemedicine. The patient expressed understanding and agreed to proceed.  Vital Signs: Because this visit was a virtual/telehealth visit, some criteria may be missing or patient reported. Any vitals not documented were not able to be obtained and vitals that have been documented are patient reported.  VideoDeclined- This patient declined Librarian, academic. Therefore the visit was completed with audio only.  Persons Participating in Visit: Patient.  AWV Questionnaire: No: Patient Medicare AWV questionnaire was not completed prior to this visit.  Cardiac Risk Factors include: advanced age (>50men, >23 women);dyslipidemia;hypertension;male gender     Objective:    Today's Vitals   10/15/23 1347  Weight: 146 lb (66.2 kg)  Height: 5\' 7"  (1.702 m)   Body mass index is 22.87 kg/m.     10/15/2023    1:50 PM 09/30/2022    1:46 PM 09/09/2021    1:53 PM 06/10/2019    3:55 PM 12/18/2016   10:32 AM  Advanced Directives  Does Patient Have a Medical Advance Directive? Yes Yes Yes Yes Yes  Type of Estate agent of [place];Living will Healthcare Power of [place];Living will Healthcare Power of [place];Living will Healthcare Power of [place];Living will   Does patient want to make changes to medical advance directive?    No - Patient declined   Copy of Healthcare Power of Attorney in Chart? No - copy requested No - copy requested No - copy requested No - copy requested     Current Medications (verified) Outpatient Encounter  Medications as of 10/15/2023  Medication Sig   amLODipine (NORVASC) 5 MG tablet TAKE ONE TABLET BY MOUTH DAILY   Ascorbic Acid (VITAMIN C) 1000 MG tablet Take 3,000 mg by mouth daily.    B Complex-C-Folic Acid (STRESS B COMPLEX PO) Take by mouth.   cyanocobalamin 500 MCG tablet Take 500 mcg by mouth daily.   doxylamine, Sleep, (UNISOM) 25 MG tablet Take 25 mg by mouth at bedtime as needed.   Glucosamine-Chondroit-Vit C-Mn (GLUCOSAMINE CHONDR 1500 COMPLX) CAPS Take 3-4 capsules by mouth daily.    Multiple Vitamin (MULTIVITAMIN) tablet Take 1 tablet by mouth daily. Contains iron   multivitamin-lutein (OCUVITE-LUTEIN) CAPS Take 1 capsule by mouth daily.   Omega-3 Fatty Acids (OMEGA-3 PO) Take 4 capsules by mouth daily.   saw palmetto 160 MG capsule Take 160 mg by mouth 2 (two) times daily. [employer] prostrate for men   No facility-administered encounter medications on file as of 10/15/2023.    Allergies (verified) Sulfa antibiotics   History: Past Medical History:  Diagnosis Date   BPH (benign prostatic hyperplasia) 09/05/2014   Saw palmetto has improved nocturia 3-5x a night to 1-2x a night.     Bunion of great toe of left Craig 10/20/2011   Hallux valgus shift on left with some early changes starting on RT    Hypertension    Melanoma of skin (HCC) 09/05/2014   Dr. Terri Piedra. Every 6 month follow up.     Past Surgical History:  Procedure Laterality Date   COLONOSCOPY  10/2007   kaplan - normal   COLONOSCOPY WITH PROPOFOL  02/10/2018   Dr.Armbruster   MELANOMA EXCISION     right arm   POLYPECTOMY     TOOTH EXTRACTION     only 1 wisdom tooth ext   Family History  Problem Relation Age of Onset   Alzheimer's disease Mother        lived to 45   Melanoma Father    Alzheimer's disease Father        lived to 19   Hypertension Father    Anxiety disorder Sister        agoraphobia   Colon cancer Neg Hx    Rectal cancer Neg Hx    Stomach cancer Neg Hx    Colon polyps Neg Hx    Esophageal  cancer Neg Hx    Social History   Socioeconomic History   Marital status: Married    Spouse name: Not on file   Number of children: Not on file   Years of education: Not on file   Highest education level: Professional school degree (e.g., MD, DDS, DVM, JD)  Occupational History   Not on file  Tobacco Use   Smoking status: Former    Current packs/day: 0.00    Average packs/day: 0.8 packs/day for 5.0 years (3.8 ttl pk-yrs)    Types: Cigarettes    Start date: 08/04/1970    Quit date: 08/05/1975    Years since quitting: 48.2   Smokeless tobacco: Never   Tobacco comments:    ONLY SMOKED FOR 5 Y EARS or less with limited cigarettes  Vaping Use   Vaping status: Never Used  Substance and Sexual Activity   Alcohol use: Yes    Alcohol/week: 10.0 - 14.0 standard drinks of alcohol    Types: 10 - 14 Standard drinks or equivalent per week    Comment: Beer/wine   Drug use: No   Sexual activity: Yes  Other Topics Concern   Not on file  Social History Narrative   Married 1975 (Wife with another Optician, dispensing). 3 daughters (38, 36,34 in 2016). 2 granddaughters (only youngest married)      Retired from [employer] for [employer]. Ran tax group.    Doing some consulting. Increase exercise. Time with dogs. Started back with rotary club). Traveling more.  Marathon October 2017 in [place].       Hobbies: running (huge passion runs marathons), biking, gardening, occasional golf   Social Drivers of Health   Financial Resource Strain: Low Risk  (10/15/2023)   Overall Financial Resource Strain (CARDIA)    Difficulty of Paying Living Expenses: Not hard at all  Food Insecurity: No Food Insecurity (10/15/2023)   Hunger Vital Sign    Worried About Running Out of Food in the Last Year: Never true    Ran Out of Food in the Last Year: Never true  Transportation Needs: No Transportation Needs (10/15/2023)   PRAPARE - Administrator, Civil Service (Medical): No    Lack of  Transportation (Non-Medical): No  Physical Activity: Sufficiently Active (10/15/2023)   Exercise Vital Sign    Days of Exercise per Week: 6 days    Minutes of Exercise per Session: 60 min  Stress: No Stress Concern Present (10/15/2023)   [employer] of Occupational Health - Occupational Stress Questionnaire    Feeling of Stress : Not at all  Social Connections: Socially Integrated (10/15/2023)   Social Connection and Isolation Panel [NHANES]    Frequency of Communication with Friends and Family: More than three times  a week    Frequency of Social Gatherings with Friends and Family: More than three times a week    Attends Religious Services: More than 4 times per year    Active Member of Clubs or Organizations: Yes    Attends Banker Meetings: 1 to 4 times per year    Marital Status: Married    Tobacco Counseling Counseling given: Not Answered Tobacco comments: ONLY SMOKED FOR 5 Y EARS or less with limited cigarettes    Clinical Intake:  Pre-visit preparation completed: Yes  Pain : No/denies pain     BMI - recorded: 22.87 Nutritional Status: BMI of 19-24  Normal Nutritional Risks: None Diabetes: No  How often do you need to have someone help you when you read instructions, pamphlets, or other written materials from your doctor or pharmacy?: 1 - Never  Interpreter Needed?: No  Information entered by :: Lanier Ensign, LPN   Activities of Daily Living     10/15/2023    1:49 PM  In your present state of health, do you have any difficulty performing the following activities:  Hearing? 0  Vision? 0  Difficulty concentrating or making decisions? 0  Walking or climbing stairs? 0  Dressing or bathing? 0  Doing errands, shopping? 0  Preparing Food and eating ? N  Using the Toilet? N  In the past six months, have you accidently leaked urine? N  Do you have problems with loss of bowel control? N  Managing your Medications? N  Managing your Finances? N   Housekeeping or managing your Housekeeping? N    Patient Care Team: Shelva Majestic, MD as PCP - General (Family Medicine) Erroll Luna, [facility] (Inactive) (Pharmacist)  Indicate any recent Medical Services you may have received from other than [facility] providers in the past year (date may be approximate).     Assessment:   This is a routine wellness examination for Thomas Craig.  Hearing/Vision screen Hearing Screening - Comments:: Pt denies any hearing issues  Vision Screening - Comments:: Pt follows up with Dr Emily Filbert for annual eye exams    Goals Addressed             This Visit's Progress    Patient Stated       Aim to continue to run 20 miles a week        Depression Screen     10/15/2023    1:51 PM 04/09/2023   12:55 PM 10/03/2022    1:45 PM 09/30/2022    1:45 PM 09/09/2021    1:51 PM 06/25/2021    2:41 PM 12/13/2020    1:17 PM  PHQ 2/9 Scores  PHQ - 2 Score 0 0 0 0 0 0 0  PHQ- 9 Score  0 0    0    Fall Risk     10/15/2023    1:54 PM 04/09/2023   12:55 PM 10/03/2022    1:45 PM 09/30/2022    9:19 AM 09/09/2021    1:55 PM  Fall Risk   Falls in the past year? 0 0 0 0 0  Number falls in past yr: 0 0 0 0 0  Injury with Fall? 0 0 0 0 0  Risk for fall due to : No Fall Risks No Fall Risks No Fall Risks Impaired vision Impaired vision  Follow up Falls prevention discussed Falls evaluation completed Falls evaluation completed Falls prevention discussed Falls prevention discussed    MEDICARE RISK AT HOME:  Medicare  Risk at Home Any stairs in or around the home?: Yes If so, are there any without handrails?: No Home free of loose throw rugs in walkways, pet beds, electrical cords, etc?: Yes Adequate lighting in your home to reduce risk of falls?: Yes Life alert?: No Use of a cane, walker or w/c?: No Grab bars in the bathroom?: No Shower chair or bench in shower?: Yes Elevated toilet seat or a handicapped toilet?: No  TIMED UP AND GO:  Was the test performed?   No  Cognitive Function: 6CIT completed    12/18/2016   10:38 AM  MMSE - Mini Mental State Exam  Not completed: --        10/15/2023    1:55 PM 09/30/2022    1:47 PM 09/09/2021    1:56 PM 06/10/2019    3:56 PM  6CIT Screen  What Year? 0 points 0 points 0 points 0 points  What month? 0 points 0 points 0 points 0 points  What time? 0 points 0 points 0 points 0 points  Count back from 20 0 points 0 points 0 points 0 points  Months in reverse 0 points 0 points 0 points 0 points  Repeat phrase 0 points 0 points 0 points 0 points  Total Score 0 points 0 points 0 points 0 points    Immunizations Immunization History  Administered Date(s) Administered   Fluad Quad(high Dose 65+) 06/14/2020, 05/30/2021   Fluad Trivalent(High Dose 65+) 04/09/2023   Hepatitis A 04/20/2009, 12/28/2014   Influenza Whole 07/04/2009   Influenza, High Dose Seasonal PF 03/30/2019, 04/29/2022   Influenza,inj,Quad PF,6+ Mos 04/20/2013, 03/19/2016, 05/25/2017   Influenza-Unspecified 05/16/2014, 05/16/2015, 06/18/2018   Moderna Sars-Covid-2 Vaccination 01/15/2021, 07/09/2021   Novavax(Covid-19) Vaccine 06/09/2023   PFIZER Comirnaty(Gray Top)Covid-19 Tri-Sucrose Vaccine 04/29/2022   PFIZER(Purple Top)SARS-COV-2 Vaccination 08/24/2019, 09/14/2019, 04/14/2020   Pneumococcal Conjugate-13 09/05/2014   Pneumococcal Polysaccharide-23 10/18/2012   Respiratory Syncytial Virus Vaccine,Recomb Aduvanted(Arexvy) 06/13/2022   Td 08/04/2005   Tdap 09/20/2015   Zoster Recombinant(Shingrix) 10/30/2021, 12/31/2021   Zoster, Live 10/18/2012    Screening Tests Health Maintenance  Topic Date Due   COVID-19 Vaccine (8 - 2024-25 season) 08/04/2023   Colonoscopy  08/07/2024   Medicare Annual Wellness (AWV)  10/14/2024   DTaP/Tdap/Td (3 - Td or Tdap) 09/19/2025   Pneumonia Vaccine 42+ Years old  Completed   INFLUENZA VACCINE  Completed   Hepatitis C Screening  Completed   Zoster Vaccines- Shingrix  Completed   HPV VACCINES   Aged Out    Health Maintenance  Health Maintenance Due  Topic Date Due   COVID-19 Vaccine (8 - 2024-25 season) 08/04/2023   Health Maintenance Items Addressed: See Nurse Notes  Additional Screening:  Vision Screening: Recommended annual ophthalmology exams for early detection of glaucoma and other disorders of the eye.  Dental Screening: Recommended annual dental exams for proper oral hygiene  Community Resource Referral / Chronic Care Management: CRR required this visit?  No   CCM required this visit?  No     Plan:     I have personally reviewed and noted the following in the patient's chart:   Medical and social history Use of alcohol, tobacco or illicit drugs  Current medications and supplements including opioid prescriptions. Patient is not currently taking opioid prescriptions. Functional ability and status Nutritional status Physical activity Advanced directives List of other physicians Hospitalizations, surgeries, and ER visits in previous 12 months Vitals Screenings to include cognitive, depression, and falls Referrals and appointments  In  addition, I have reviewed and discussed with patient certain preventive protocols, quality metrics, and best practice recommendations. A written personalized care plan for preventive services as well as general preventive health recommendations were provided to patient.     Marzella Schlein, LPN   1/61/0960   After Visit Summary: (MyChart) Due to this being a telephonic visit, the after visit summary with patients personalized plan was offered to patient via MyChart   Notes: Nothing significant to report at this time.

## 2023-10-15 NOTE — Patient Instructions (Signed)
 Thomas Craig , Thank you for taking time to come for your Medicare Wellness Visit. I appreciate your ongoing commitment to your health goals. Please review the following plan we discussed and let me know if I can assist you in the future.   Referrals/Orders/Follow-Ups/Clinician Recommendations: maintain health and activity, conitnue to work on running 20 miles a week    This is a list of the screening recommended for you and due dates:  Health Maintenance  Topic Date Due   COVID-19 Vaccine (8 - 2024-25 season) 08/04/2023   Medicare Annual Wellness Visit  10/01/2023   Colon Cancer Screening  08/07/2024   DTaP/Tdap/Td vaccine (3 - Td or Tdap) 09/19/2025   Pneumonia Vaccine  Completed   Flu Shot  Completed   Hepatitis C Screening  Completed   Zoster (Shingles) Vaccine  Completed   HPV Vaccine  Aged Out    Advanced directives: (Copy Requested) Please bring a copy of your health care power of attorney and living will to the office to be added to your chart at your convenience. You can mail to [facility] [address] [place], [place] [address] or email to [email or web address]  Next Medicare Annual Wellness Visit scheduled for next year: Yes

## 2024-04-13 ENCOUNTER — Ambulatory Visit (INDEPENDENT_AMBULATORY_CARE_PROVIDER_SITE_OTHER): Admitting: Family Medicine

## 2024-04-13 ENCOUNTER — Encounter: Payer: Self-pay | Admitting: Family Medicine

## 2024-04-13 VITALS — BP 130/69 | HR 71 | Temp 97.7°F | Ht 67.0 in | Wt 150.4 lb

## 2024-04-13 DIAGNOSIS — Z23 Encounter for immunization: Secondary | ICD-10-CM

## 2024-04-13 DIAGNOSIS — I10 Essential (primary) hypertension: Secondary | ICD-10-CM | POA: Diagnosis not present

## 2024-04-13 DIAGNOSIS — E785 Hyperlipidemia, unspecified: Secondary | ICD-10-CM | POA: Diagnosis not present

## 2024-04-13 NOTE — Progress Notes (Signed)
 Phone [phone] In person visit   Subjective:   Thomas Craig is a 78 y.o. year old very pleasant male patient who presents for/with See problem oriented charting Chief Complaint  Patient presents with   Hypertension    Past Medical History-  Patient Active Problem List   Diagnosis Date Noted   History of melanoma 09/05/2014    Priority: High   Ectatic abdominal aorta (HCC) 12/24/2021    Priority: Medium    History of adenomatous polyp of colon 02/23/2018    Priority: Medium    BPH (benign prostatic hyperplasia) 09/05/2014    Priority: Medium    Essential hypertension 09/05/2014    Priority: Medium    Hyperlipidemia 09/05/2014    Priority: Medium    History of hematuria 06/19/2016    Priority: Low   Bunion of great toe of left foot 10/20/2011    Priority: Low    Medications- reviewed and updated Current Outpatient Medications  Medication Sig Dispense Refill   amLODipine  (NORVASC ) 5 MG tablet TAKE ONE TABLET BY MOUTH DAILY 90 tablet 3   Ascorbic Acid (VITAMIN C) 1000 MG tablet Take 3,000 mg by mouth daily.      B Complex-C-Folic Acid (STRESS B COMPLEX PO) Take by mouth.     cyanocobalamin 500 MCG tablet Take 500 mcg by mouth daily.     doxylamine, Sleep, (UNISOM) 25 MG tablet Take 25 mg by mouth at bedtime as needed.     Glucosamine-Chondroit-Vit C-Mn (GLUCOSAMINE CHONDR 1500 COMPLX) CAPS Take 3-4 capsules by mouth daily.      Multiple Vitamin (MULTIVITAMIN) tablet Take 1 tablet by mouth daily. Contains iron     multivitamin-lutein (OCUVITE-LUTEIN) CAPS Take 1 capsule by mouth daily.     Omega-3 Fatty Acids (OMEGA-3 PO) Take 4 capsules by mouth daily.     saw palmetto 160 MG capsule Take 160 mg by mouth 2 (two) times daily. [employer] prostrate for men     No current facility-administered medications for this visit.     Objective:  BP 130/69 Comment: average of last 4 readings with most recent 130/68 as well  Pulse 71   Temp 97.7 F (36.5 C) (Temporal)   Ht 5'  7 (1.702 m)   Wt 150 lb 6.4 oz (68.2 kg)   SpO2 96%   BMI 23.56 kg/m  Gen: NAD, resting comfortably Tympanic membrane normal bilaterally after curetting on the right and removing large ball of cerumen CV: RRR no murmurs rubs or gallops Lungs: CTAB no crackles, wheeze, rhonchi Abdomen: soft/nontender/nondistended/normal bowel sounds. No rebound or guarding.  Ext: no edema Skin: warm, dry Neuro: grossly normal, moves all extremities    Assessment and Plan   #hypertension S: medication: Amlodipine  5 mg Home readings #s: excellent -down to running 3-4 days a week proactively cut back 3 miles -down from 25 miles at age 27. Two workouts a week with trainer, tennis as well singles.  A/P: well controlled continue current medications    #hyperlipidemia S: Medication:omega-3- wants to stay off statin - Mild elevations on coronary calcium  score of 32-which was 23rd percentile for age on 10/12/2019 Lab Results  Component Value Date   CHOL 243 (H) 10/07/2023   HDL 99.80 10/07/2023   LDLCALC 133 (H) 10/07/2023   LDLDIRECT 100.5 10/27/2011   TRIG 50.0 10/07/2023   CHOLHDL 2 10/07/2023   A/P: lipids mildly high but has strongly preferred to stay off medicine beyond omega 3- will repeat next visit  # Very mild hyponatremia-continue monitor  from 131-134 for years- opts out of repeat today  # Mild leukopenia-WBC in 3s at times - opts out of repeat today  #History of melanoma- follows with dermatology annually- visit later this month- Dr. Elnor   #right ear cerumen impaction-complete impaction able to be moved with irrigation and subsequently with curetting to remove completely  #Health maintenance- flu shot today and hopefully COVID shot within next month Immunization History  Administered Date(s) Administered   Fluad Quad(high Dose 65+) 06/14/2020, 05/30/2021   Fluad Trivalent(High Dose 65+) 04/09/2023   Hepatitis A 04/20/2009, 12/28/2014   INFLUENZA, HIGH DOSE SEASONAL PF 03/30/2019,  04/29/2022, 04/13/2024   Influenza Whole 07/04/2009   Influenza,inj,Quad PF,6+ Mos 04/20/2013, 03/19/2016, 05/25/2017   Influenza-Unspecified 05/16/2014, 05/16/2015, 06/18/2018   Moderna Sars-Covid-2 Vaccination 01/15/2021, 07/09/2021   Novavax(Covid-19) Vaccine 06/09/2023   PFIZER Comirnaty(Gray Top)Covid-19 Tri-Sucrose Vaccine 04/29/2022   PFIZER(Purple Top)SARS-COV-2 Vaccination 08/24/2019, 09/14/2019, 04/14/2020   Pneumococcal Conjugate-13 09/05/2014   Pneumococcal Polysaccharide-23 10/18/2012   Respiratory Syncytial Virus Vaccine,Recomb Aduvanted(Arexvy) 06/13/2022   Td 08/04/2005   Tdap 09/20/2015   Zoster Recombinant(Shingrix) 10/30/2021, 12/31/2021   Zoster, Live 10/18/2012   Recommended follow up: Return in about 6 months (around 10/11/2024) for followup or sooner if needed.Schedule b4 you leave. Future Appointments  Date Time Provider Department Center  10/19/2024  2:20 PM [facility] VISIT 1 [facility] [place]    Lab/Order associations:   ICD-10-CM   1. Essential hypertension  I10     2. Hyperlipidemia, unspecified hyperlipidemia type  E78.5     3. Need for influenza vaccination  Z23 Flu vaccine HIGH DOSE PF(Fluzone Trivalent)      No orders of the defined types were placed in this encounter.   Return precautions advised.  Garnette Lukes, MD

## 2024-04-13 NOTE — Patient Instructions (Addendum)
 Health Maintenance Due  Topic Date Due   Colonoscopy  08/07/2024  [facility] contact Please call to schedule visit if you don't hear by mid january Address: [address] [place], [place], [place] [address] Phone: [phone]   No labs today with good results last visit  Blood pressure looks great at home- thanks for bringing those readings  Flu shot today- thanks for doing that. Covid shot I would say 2-4 weeks- we are trying to come up with a universal plan across [facility] to address this. Reach out in 3 weeks if you haven't heard anything.   Recommended follow up: Return in about 6 months (around 10/11/2024) for followup or sooner if needed.Schedule b4 you leave.

## 2024-05-04 DIAGNOSIS — L814 Other melanin hyperpigmentation: Secondary | ICD-10-CM | POA: Diagnosis not present

## 2024-05-04 DIAGNOSIS — L821 Other seborrheic keratosis: Secondary | ICD-10-CM | POA: Diagnosis not present

## 2024-05-04 DIAGNOSIS — Z08 Encounter for follow-up examination after completed treatment for malignant neoplasm: Secondary | ICD-10-CM | POA: Diagnosis not present

## 2024-05-04 DIAGNOSIS — D225 Melanocytic nevi of trunk: Secondary | ICD-10-CM | POA: Diagnosis not present

## 2024-05-04 DIAGNOSIS — L570 Actinic keratosis: Secondary | ICD-10-CM | POA: Diagnosis not present

## 2024-05-04 DIAGNOSIS — Z86006 Personal history of melanoma in-situ: Secondary | ICD-10-CM | POA: Diagnosis not present

## 2024-05-04 DIAGNOSIS — Z85828 Personal history of other malignant neoplasm of skin: Secondary | ICD-10-CM | POA: Diagnosis not present

## 2024-06-02 DIAGNOSIS — H5213 Myopia, bilateral: Secondary | ICD-10-CM | POA: Diagnosis not present

## 2024-06-02 DIAGNOSIS — H02831 Dermatochalasis of right upper eyelid: Secondary | ICD-10-CM | POA: Diagnosis not present

## 2024-06-02 DIAGNOSIS — H02834 Dermatochalasis of left upper eyelid: Secondary | ICD-10-CM | POA: Diagnosis not present

## 2024-06-02 DIAGNOSIS — H2513 Age-related nuclear cataract, bilateral: Secondary | ICD-10-CM | POA: Diagnosis not present

## 2024-06-13 ENCOUNTER — Encounter: Payer: Self-pay | Admitting: Family Medicine

## 2024-06-17 DIAGNOSIS — H35373 Puckering of macula, bilateral: Secondary | ICD-10-CM | POA: Diagnosis not present

## 2024-06-17 DIAGNOSIS — H2513 Age-related nuclear cataract, bilateral: Secondary | ICD-10-CM | POA: Diagnosis not present

## 2024-06-17 DIAGNOSIS — H5213 Myopia, bilateral: Secondary | ICD-10-CM | POA: Diagnosis not present

## 2024-07-30 ENCOUNTER — Encounter: Payer: Self-pay | Admitting: Family Medicine

## 2024-08-12 ENCOUNTER — Encounter: Payer: Self-pay | Admitting: Family Medicine

## 2024-08-12 ENCOUNTER — Ambulatory Visit (INDEPENDENT_AMBULATORY_CARE_PROVIDER_SITE_OTHER): Admitting: Family Medicine

## 2024-08-12 VITALS — BP 122/68 | HR 76 | Temp 98.4°F | Ht 67.0 in | Wt 151.4 lb

## 2024-08-12 DIAGNOSIS — R4189 Other symptoms and signs involving cognitive functions and awareness: Secondary | ICD-10-CM

## 2024-08-12 DIAGNOSIS — I10 Essential (primary) hypertension: Secondary | ICD-10-CM | POA: Diagnosis not present

## 2024-08-12 DIAGNOSIS — E785 Hyperlipidemia, unspecified: Secondary | ICD-10-CM | POA: Diagnosis not present

## 2024-08-12 NOTE — Patient Instructions (Addendum)
 Health Maintenance Due  Topic Date Due   Colonoscopy  08/07/2024  [facility] contact Please call to schedule visit and/or procedure IF you do not hear within a week Address: [address] [place], [place], [place] [address] Phone: [phone]   Only mild changes on memory testing- no obvious dementia at this time but we can continue to monitor- can recheck every 1-2 years or if worsens  Recommended follow up: Return in about 6 months (around 02/09/2025) for followup or sooner if needed.Schedule b4 you leave.

## 2024-08-12 NOTE — Progress Notes (Signed)
 " Phone [phone] In person visit   Subjective:   Thomas Craig is a 79 y.o. year old very pleasant male patient who presents for/with See problem oriented charting Chief Complaint  Patient presents with   Medical Management of Chronic Issues    Would like to discuss potential dementia/ potential memory issues, both parents had dementia; mmse placed in room; pt had cataract surgery on right eye 08/08/2024;     Past Medical History-  Patient Active Problem List   Diagnosis Date Noted   History of melanoma 09/05/2014    Priority: High   Ectatic abdominal aorta 12/24/2021    Priority: Medium    History of adenomatous polyp of colon 02/23/2018    Priority: Medium    BPH (benign prostatic hyperplasia) 09/05/2014    Priority: Medium    Essential hypertension 09/05/2014    Priority: Medium    Hyperlipidemia 09/05/2014    Priority: Medium    History of hematuria 06/19/2016    Priority: Low   Bunion of great toe of left foot 10/20/2011    Priority: Low    Medications- reviewed and updated Current Outpatient Medications  Medication Sig Dispense Refill   amLODipine  (NORVASC ) 5 MG tablet TAKE ONE TABLET BY MOUTH DAILY 90 tablet 3   Ascorbic Acid (VITAMIN C) 1000 MG tablet Take 3,000 mg by mouth daily.      B Complex-C-Folic Acid (STRESS B COMPLEX PO) Take by mouth.     cyanocobalamin 500 MCG tablet Take 500 mcg by mouth daily.     doxylamine, Sleep, (UNISOM) 25 MG tablet Take 25 mg by mouth at bedtime as needed.     Glucosamine-Chondroit-Vit C-Mn (GLUCOSAMINE CHONDR 1500 COMPLX) CAPS Take 3-4 capsules by mouth daily.      Multiple Vitamin (MULTIVITAMIN) tablet Take 1 tablet by mouth daily. Contains iron     multivitamin-lutein (OCUVITE-LUTEIN) CAPS Take 1 capsule by mouth daily.     Omega-3 Fatty Acids (OMEGA-3 PO) Take 4 capsules by mouth daily.     saw palmetto 160 MG capsule Take 160 mg by mouth 2 (two) times daily. [employer] prostrate for men     No current facility-administered  medications for this visit.     Objective:  BP 122/68 (BP Location: Left Arm, Patient Position: Sitting, Cuff Size: Normal)   Pulse 76   Temp 98.4 F (36.9 C) (Temporal)   Ht 5' 7 (1.702 m)   Wt 151 lb 6.4 oz (68.7 kg)   SpO2 98%   BMI 23.71 kg/m  Gen: NAD, resting comfortably     Assessment and Plan   # memory concern S:from intake   Would like to discuss potential dementia/ potential memory issues, both parents had dementia  Reports alzheimer's type  He denies  any obvious symptom(s). He sparingly forgets things sometimes per his perspective but wife feels it is more common than he thinks.  -regular exercise and no hearing issues A/P: Patient does not feel he has significant memory changes but wife has had some concern and he has family history so wanted to present for testing.  MMSE 28/30 reassuring (date lost 1 point and 1 point for delayed recall) .  We discussed option of rechecking this every 1 to 2 years-he is in agreement but knows we can check this sooner if needed if he begins to note symptoms-symptoms were not significant enough for us  to pursue reversible memory loss workup at this time  #hypertension S: medication: Amlodipine  5 mg A/P: Well-controlled-continue current medication   #  hyperlipidemia S: Medication:omega-3- wants to stay off statin - Mild elevations on coronary calcium  score of 32-which was 23rd percentile for age on 10/12/2019 Lab Results  Component Value Date   CHOL 243 (H) 10/07/2023   HDL 99.80 10/07/2023   LDLCALC 133 (H) 10/07/2023   LDLDIRECT 100.5 10/27/2011   TRIG 50.0 10/07/2023   CHOLHDL 2 10/07/2023   A/P: Lipids mildly elevated-he is not yet due for full lipids but we elected to do these at 61-month follow-up-since CT calcium  scoring will be over 50 years old that point might be worth rechecking to look for progression   Recommended follow up: Return in about 6 months (around 02/09/2025) for followup or sooner if needed.Schedule b4  you leave. Future Appointments  Date Time Provider Department Center  10/11/2024  8:20 AM Katrinka Garnette KIDD, MD [facility]  10/19/2024  2:20 PM [facility] [place] VISIT 1 [facility]  02/09/2025  9:20 AM Katrinka Garnette KIDD, MD [facility]    Lab/Order associations:   ICD-10-CM   1. Concern about memory  R41.89     2. Essential hypertension  I10     3. Hyperlipidemia, unspecified hyperlipidemia type  E78.5       No orders of the defined types were placed in this encounter.   Return precautions advised.  Garnette Katrinka, MD  "

## 2024-08-23 ENCOUNTER — Other Ambulatory Visit: Payer: Self-pay | Admitting: Family Medicine

## 2024-08-23 ENCOUNTER — Ambulatory Visit: Payer: Self-pay

## 2024-08-23 NOTE — Telephone Encounter (Signed)
 With me being out of town for the next week moving up is going to be challenging less there is an opening-I think I had some patients cancel as early as February 3 from earlier today though if they would like to recheck the schedule (possible those got filled)

## 2024-08-23 NOTE — Telephone Encounter (Signed)
 FYI Only or Action Required?: Action required by provider: request for appointment. Scheduled soonest appt with PCP 2/13. Can pt be worked in sooner with PCP? Please call wife if possible [phone].  Patient was last seen in primary care on 08/12/2024 by Katrinka Garnette KIDD, MD.  Called Nurse Triage reporting Altered Mental Status and Dementia.  Symptoms began 5 months.  Interventions attempted: Nothing.  Symptoms are: gradually worsening.  Triage Disposition: See PCP Within 2 Weeks  Patient/caregiver understands and will follow disposition?: Yes    Spoke with pts wife Mallie (on [place]). Saw PCP 1/9 for confusion/dementia screening. Was fine on eval and dx with mild memory loss per wife. Not diagnosed with dementia.   Wife reports persistent memory loss, no changes since that appt. Forgetful. Repeats self frequently. Took 5 hours to make airline reservations. No hallucinations. No fever. No weakness, numbness or changes to speech or vision. Symptoms present for past 5 months. Wife concerned pt does have dementia and if wondering pt is a candidate for medication.   Scheduled soonest appt with PCP 2/13. Forwarding request to office to see if pt can be seen in next 2 weeks.     Message from Olam RAMAN sent at 08/23/2024 10:31 AM EST  Reason for Triage: pt saw provider a couple weeks ago about dementia. Dr tested pt and said he was fine, but wife stated he does have dementia. He is forgetful, repeats everything and asks things 3/4 times. Cb: wife: [phone]   Reason for Disposition  [1] Longstanding confusion (e.g., dementia, stroke) AND [2] NO worsening or change  Answer Assessment - Initial Assessment Questions 1. LEVEL OF CONSCIOUSNESS: How are they (the patient) acting right now? (e.g., alert-oriented, confused, lethargic, stuporous, comatose)     AAO  2. ONSET: When did the confusion start?  (e.g., minutes, hours, days)     5 months ago  3. PATTERN: Does this come and go, or  has it been constant since it started?  Is it present now?     Intermittent  4. ALCOHOL or DRUGS: Have they been drinking alcohol or taking any drugs?      *No Answer*  5. NARCOTIC MEDICINES: Have they been receiving any narcotic medications? (e.g., morphine, Vicodin)     None  6. CAUSE: What do you think is causing the confusion?      Undiagnosed dementia  7. OTHER SYMPTOMS: Are there any other symptoms? (e.g., difficulty breathing, fever, headache, weakness)     Denies  Protocols used: Confusion - Delirium-A-AH

## 2024-08-23 NOTE — Telephone Encounter (Signed)
 Scheduled 09/06/2024 at 3pm

## 2024-08-23 NOTE — Telephone Encounter (Signed)
 Please see triage message and advise.

## 2024-08-24 ENCOUNTER — Encounter: Payer: Self-pay | Admitting: Gastroenterology

## 2024-09-06 ENCOUNTER — Encounter: Payer: Self-pay | Admitting: Family Medicine

## 2024-09-06 ENCOUNTER — Ambulatory Visit: Admitting: Family Medicine

## 2024-09-06 VITALS — BP 135/80 | HR 68 | Temp 97.8°F | Ht 67.0 in | Wt 151.4 lb

## 2024-09-06 DIAGNOSIS — R413 Other amnesia: Secondary | ICD-10-CM | POA: Diagnosis not present

## 2024-09-06 DIAGNOSIS — I10 Essential (primary) hypertension: Secondary | ICD-10-CM | POA: Diagnosis not present

## 2024-09-06 NOTE — Patient Instructions (Addendum)
 We have placed a referral to MRI brain through [facility].  Their phone number is [phone].  Please call them if you have not heard in 1 week  We have placed a referral for you today to  neurology- please call their # if you do not hear within a week.   Please stop by lab before you go If you have mychart- we will send your results within 3 business days of us  receiving them.  If you do not have mychart- we will call you about results within 5 business days of us  receiving them.  *please also note that you will see labs on mychart as soon as they post. I will later go in and write notes on them- will say notes from Dr. Katrinka   Recommended follow up: Return for next already scheduled visit or sooner if needed. -you can cancel the march 10 visit with me and keep the July visit on your way out.

## 2024-09-07 ENCOUNTER — Ambulatory Visit: Payer: Self-pay | Admitting: Family Medicine

## 2024-09-07 LAB — COMPREHENSIVE METABOLIC PANEL WITH GFR
AG Ratio: 1.8 (calc) (ref 1.0–2.5)
ALT: 18 U/L (ref 9–46)
AST: 18 U/L (ref 10–35)
Albumin: 4.6 g/dL (ref 3.6–5.1)
Alkaline phosphatase (APISO): 38 U/L (ref 35–144)
BUN: 17 mg/dL (ref 7–25)
CO2: 28 mmol/L (ref 20–32)
Calcium: 9.4 mg/dL (ref 8.6–10.3)
Chloride: 98 mmol/L (ref 98–110)
Creat: 0.79 mg/dL (ref 0.70–1.28)
Globulin: 2.5 g/dL (ref 1.9–3.7)
Glucose, Bld: 85 mg/dL (ref 65–99)
Potassium: 4 mmol/L (ref 3.5–5.3)
Sodium: 135 mmol/L (ref 135–146)
Total Bilirubin: 0.5 mg/dL (ref 0.2–1.2)
Total Protein: 7.1 g/dL (ref 6.1–8.1)
eGFR: 91 mL/min/{1.73_m2}

## 2024-09-07 LAB — CBC WITH DIFFERENTIAL/PLATELET
Absolute Lymphocytes: 1663 {cells}/uL (ref 850–3900)
Absolute Monocytes: 313 {cells}/uL (ref 200–950)
Basophils Absolute: 32 {cells}/uL (ref 0–200)
Basophils Relative: 0.6 %
Eosinophils Absolute: 49 {cells}/uL (ref 15–500)
Eosinophils Relative: 0.9 %
HCT: 40 % (ref 39.4–51.1)
Hemoglobin: 13.6 g/dL (ref 13.2–17.1)
MCH: 32.2 pg (ref 27.0–33.0)
MCHC: 34 g/dL (ref 31.6–35.4)
MCV: 94.6 fL (ref 81.4–101.7)
MPV: 9.6 fL (ref 7.5–12.5)
Monocytes Relative: 5.8 %
Neutro Abs: 3343 {cells}/uL (ref 1500–7800)
Neutrophils Relative %: 61.9 %
Platelets: 195 10*3/uL (ref 140–400)
RBC: 4.23 Million/uL (ref 4.20–5.80)
RDW: 12.4 % (ref 11.0–15.0)
Total Lymphocyte: 30.8 %
WBC: 5.4 10*3/uL (ref 3.8–10.8)

## 2024-09-07 LAB — TSH: TSH: 2.66 m[IU]/L (ref 0.40–4.50)

## 2024-09-07 LAB — VITAMIN B12: Vitamin B-12: 1114 pg/mL — ABNORMAL HIGH (ref 200–1100)

## 2024-09-16 ENCOUNTER — Ambulatory Visit: Admitting: Family Medicine

## 2024-10-11 ENCOUNTER — Ambulatory Visit: Admitting: Family Medicine

## 2024-10-19 ENCOUNTER — Ambulatory Visit

## 2025-02-09 ENCOUNTER — Ambulatory Visit: Admitting: Family Medicine
